# Patient Record
Sex: Male | Born: 1945 | Race: White | Hispanic: No | Marital: Married | Smoking: Former smoker
Health system: Southern US, Community
[De-identification: ages and names within clinical notes are randomized; demographics above are authoritative.]

## PROBLEM LIST (undated history)

## (undated) DIAGNOSIS — I10 Essential (primary) hypertension: Secondary | ICD-10-CM

## (undated) DIAGNOSIS — Z79899 Other long term (current) drug therapy: Secondary | ICD-10-CM

## (undated) DIAGNOSIS — B999 Unspecified infectious disease: Secondary | ICD-10-CM

## (undated) DIAGNOSIS — I6322 Cerebral infarction due to unspecified occlusion or stenosis of basilar arteries: Secondary | ICD-10-CM

## (undated) DIAGNOSIS — L98499 Non-pressure chronic ulcer of skin of other sites with unspecified severity: Secondary | ICD-10-CM

## (undated) DIAGNOSIS — H269 Unspecified cataract: Secondary | ICD-10-CM

## (undated) DIAGNOSIS — E782 Mixed hyperlipidemia: Secondary | ICD-10-CM

## (undated) DIAGNOSIS — K5730 Diverticulosis of large intestine without perforation or abscess without bleeding: Secondary | ICD-10-CM

## (undated) DIAGNOSIS — E7800 Pure hypercholesterolemia, unspecified: Secondary | ICD-10-CM

## (undated) DIAGNOSIS — M1990 Unspecified osteoarthritis, unspecified site: Secondary | ICD-10-CM

## (undated) DIAGNOSIS — I499 Cardiac arrhythmia, unspecified: Secondary | ICD-10-CM

## (undated) DIAGNOSIS — K589 Irritable bowel syndrome without diarrhea: Secondary | ICD-10-CM

## (undated) DIAGNOSIS — I219 Acute myocardial infarction, unspecified: Secondary | ICD-10-CM

## (undated) DIAGNOSIS — IMO0001 Reserved for inherently not codable concepts without codable children: Secondary | ICD-10-CM

## (undated) DIAGNOSIS — K219 Gastro-esophageal reflux disease without esophagitis: Secondary | ICD-10-CM

## (undated) DIAGNOSIS — G629 Polyneuropathy, unspecified: Secondary | ICD-10-CM

## (undated) DIAGNOSIS — J449 Chronic obstructive pulmonary disease, unspecified: Secondary | ICD-10-CM

## (undated) DIAGNOSIS — I6330 Cerebral infarction due to thrombosis of unspecified cerebral artery: Secondary | ICD-10-CM

## (undated) DIAGNOSIS — I209 Angina pectoris, unspecified: Secondary | ICD-10-CM

## (undated) DIAGNOSIS — K929 Disease of digestive system, unspecified: Secondary | ICD-10-CM

## (undated) DIAGNOSIS — C801 Malignant (primary) neoplasm, unspecified: Secondary | ICD-10-CM

## (undated) DIAGNOSIS — J189 Pneumonia, unspecified organism: Secondary | ICD-10-CM

## (undated) DIAGNOSIS — H9193 Unspecified hearing loss, bilateral: Secondary | ICD-10-CM

## (undated) DIAGNOSIS — F32A Depression, unspecified: Secondary | ICD-10-CM

## (undated) DIAGNOSIS — Z01810 Encounter for preprocedural cardiovascular examination: Secondary | ICD-10-CM

## (undated) DIAGNOSIS — K5792 Diverticulitis of intestine, part unspecified, without perforation or abscess without bleeding: Secondary | ICD-10-CM

## (undated) DIAGNOSIS — S7000XA Contusion of unspecified hip, initial encounter: Secondary | ICD-10-CM

## (undated) DIAGNOSIS — M4800 Spinal stenosis, site unspecified: Secondary | ICD-10-CM

## (undated) DIAGNOSIS — R42 Dizziness and giddiness: Secondary | ICD-10-CM

## (undated) DIAGNOSIS — I472 Ventricular tachycardia: Secondary | ICD-10-CM

## (undated) DIAGNOSIS — R51 Headache: Secondary | ICD-10-CM

## (undated) DIAGNOSIS — R519 Headache, unspecified: Secondary | ICD-10-CM

## (undated) DIAGNOSIS — Z96651 Presence of right artificial knee joint: Secondary | ICD-10-CM

## (undated) DIAGNOSIS — M797 Fibromyalgia: Secondary | ICD-10-CM

## (undated) DIAGNOSIS — R011 Cardiac murmur, unspecified: Secondary | ICD-10-CM

## (undated) DIAGNOSIS — E119 Type 2 diabetes mellitus without complications: Secondary | ICD-10-CM

## (undated) DIAGNOSIS — J45909 Unspecified asthma, uncomplicated: Secondary | ICD-10-CM

## (undated) DIAGNOSIS — F329 Major depressive disorder, single episode, unspecified: Secondary | ICD-10-CM

## (undated) DIAGNOSIS — G8190 Hemiplegia, unspecified affecting unspecified side: Secondary | ICD-10-CM

## (undated) DIAGNOSIS — Z8719 Personal history of other diseases of the digestive system: Secondary | ICD-10-CM

## (undated) DIAGNOSIS — Z950 Presence of cardiac pacemaker: Secondary | ICD-10-CM

## (undated) DIAGNOSIS — G4733 Obstructive sleep apnea (adult) (pediatric): Secondary | ICD-10-CM

## (undated) DIAGNOSIS — R55 Syncope and collapse: Secondary | ICD-10-CM

## (undated) DIAGNOSIS — I2510 Atherosclerotic heart disease of native coronary artery without angina pectoris: Secondary | ICD-10-CM

## (undated) DIAGNOSIS — G119 Hereditary ataxia, unspecified: Secondary | ICD-10-CM

## (undated) DIAGNOSIS — T7500XA Unspecified effects of lightning, initial encounter: Secondary | ICD-10-CM

## (undated) DIAGNOSIS — K635 Polyp of colon: Secondary | ICD-10-CM

## (undated) DIAGNOSIS — I639 Cerebral infarction, unspecified: Secondary | ICD-10-CM

## (undated) DIAGNOSIS — R410 Disorientation, unspecified: Secondary | ICD-10-CM

## (undated) DIAGNOSIS — N200 Calculus of kidney: Secondary | ICD-10-CM

## (undated) DIAGNOSIS — M23231 Derangement of other medial meniscus due to old tear or injury, right knee: Secondary | ICD-10-CM

## (undated) DIAGNOSIS — I495 Sick sinus syndrome: Secondary | ICD-10-CM

## (undated) DIAGNOSIS — F419 Anxiety disorder, unspecified: Secondary | ICD-10-CM

## (undated) DIAGNOSIS — I509 Heart failure, unspecified: Secondary | ICD-10-CM

## (undated) HISTORY — DX: Disease of digestive system, unspecified: K92.9

## (undated) HISTORY — DX: Mixed hyperlipidemia: E78.2

## (undated) HISTORY — DX: Essential (primary) hypertension: I10

## (undated) HISTORY — DX: Unspecified hearing loss, bilateral: H91.93

## (undated) HISTORY — PX: COLONOSCOPY: SHX174

## (undated) HISTORY — DX: Ventricular tachycardia: I47.2

## (undated) HISTORY — DX: Syncope and collapse: R55

## (undated) HISTORY — DX: Gastro-esophageal reflux disease without esophagitis: K21.9

## (undated) HISTORY — DX: Spinal stenosis, site unspecified: M48.00

## (undated) HISTORY — DX: Fibromyalgia: M79.7

## (undated) HISTORY — DX: Irritable bowel syndrome without diarrhea: K58.9

## (undated) HISTORY — DX: Obstructive sleep apnea (adult) (pediatric): G47.33

## (undated) HISTORY — DX: Contusion of unspecified hip, initial encounter: S70.00XA

## (undated) HISTORY — DX: Encounter for preprocedural cardiovascular examination: Z01.810

## (undated) HISTORY — PX: SHOULDER SURGERY: SHX246

## (undated) HISTORY — PX: INSERT / REPLACE / REMOVE PACEMAKER: SUR710

## (undated) HISTORY — DX: Other long term (current) drug therapy: Z79.899

## (undated) HISTORY — DX: Hereditary ataxia, unspecified: G11.9

## (undated) HISTORY — DX: Unspecified infectious disease: B99.9

## (undated) HISTORY — DX: Cerebral infarction due to thrombosis of unspecified cerebral artery: I63.30

## (undated) HISTORY — DX: Diverticulosis of large intestine without perforation or abscess without bleeding: K57.30

## (undated) HISTORY — DX: Presence of right artificial knee joint: Z96.651

## (undated) HISTORY — DX: Unspecified osteoarthritis, unspecified site: M19.90

## (undated) HISTORY — PX: COLON RESECTION: SHX5231

## (undated) HISTORY — DX: Cerebral infarction due to unspecified occlusion or stenosis of basilar artery: I63.22

## (undated) HISTORY — PX: CARDIAC CATHETERIZATION: SHX172

## (undated) HISTORY — PX: OTHER SURGICAL HISTORY: SHX169

## (undated) HISTORY — PX: CORONARY ANGIOPLASTY: SHX604

## (undated) HISTORY — DX: Non-pressure chronic ulcer of skin of other sites with unspecified severity: L98.499

## (undated) HISTORY — DX: Derangement of other medial meniscus due to old tear or injury, right knee: M23.231

## (undated) HISTORY — DX: Presence of cardiac pacemaker: Z95.0

## (undated) HISTORY — DX: Atherosclerotic heart disease of native coronary artery without angina pectoris: I25.10

## (undated) HISTORY — PX: APPENDECTOMY: SHX54

## (undated) HISTORY — DX: Sick sinus syndrome: I49.5

## (undated) HISTORY — DX: Type 2 diabetes mellitus without complications: E11.9

## (undated) HISTORY — DX: Hemiplegia, unspecified affecting unspecified side: G81.90

---

## 1998-04-30 ENCOUNTER — Inpatient Hospital Stay (HOSPITAL_COMMUNITY): Admission: EM | Admit: 1998-04-30 | Discharge: 1998-05-01 | Payer: Self-pay | Admitting: Cardiology

## 2002-10-01 ENCOUNTER — Observation Stay (HOSPITAL_COMMUNITY): Admission: AD | Admit: 2002-10-01 | Discharge: 2002-10-02 | Payer: Self-pay | Admitting: Cardiology

## 2003-12-04 ENCOUNTER — Observation Stay (HOSPITAL_COMMUNITY): Admission: AD | Admit: 2003-12-04 | Discharge: 2003-12-05 | Payer: Self-pay | Admitting: Cardiology

## 2005-02-16 ENCOUNTER — Emergency Department (HOSPITAL_COMMUNITY): Admission: EM | Admit: 2005-02-16 | Discharge: 2005-02-16 | Payer: Self-pay | Admitting: Emergency Medicine

## 2005-02-16 ENCOUNTER — Ambulatory Visit: Payer: Self-pay | Admitting: Family Medicine

## 2005-02-27 ENCOUNTER — Ambulatory Visit: Payer: Self-pay | Admitting: Family Medicine

## 2005-04-04 ENCOUNTER — Ambulatory Visit: Payer: Self-pay | Admitting: Family Medicine

## 2005-07-06 ENCOUNTER — Ambulatory Visit: Payer: Self-pay | Admitting: Family Medicine

## 2005-10-03 ENCOUNTER — Ambulatory Visit: Payer: Self-pay | Admitting: Family Medicine

## 2005-10-11 ENCOUNTER — Ambulatory Visit: Payer: Self-pay | Admitting: Family Medicine

## 2013-12-27 DIAGNOSIS — G8190 Hemiplegia, unspecified affecting unspecified side: Secondary | ICD-10-CM

## 2013-12-27 HISTORY — DX: Hemiplegia, unspecified affecting unspecified side: G81.90

## 2013-12-30 DIAGNOSIS — E119 Type 2 diabetes mellitus without complications: Secondary | ICD-10-CM | POA: Insufficient documentation

## 2013-12-30 DIAGNOSIS — I6322 Cerebral infarction due to unspecified occlusion or stenosis of basilar arteries: Secondary | ICD-10-CM | POA: Insufficient documentation

## 2013-12-30 DIAGNOSIS — R55 Syncope and collapse: Secondary | ICD-10-CM

## 2013-12-30 DIAGNOSIS — I10 Essential (primary) hypertension: Secondary | ICD-10-CM

## 2013-12-30 DIAGNOSIS — I6330 Cerebral infarction due to thrombosis of unspecified cerebral artery: Secondary | ICD-10-CM

## 2013-12-30 DIAGNOSIS — M797 Fibromyalgia: Secondary | ICD-10-CM | POA: Insufficient documentation

## 2013-12-30 DIAGNOSIS — I639 Cerebral infarction, unspecified: Secondary | ICD-10-CM | POA: Insufficient documentation

## 2013-12-30 DIAGNOSIS — K929 Disease of digestive system, unspecified: Secondary | ICD-10-CM | POA: Insufficient documentation

## 2013-12-30 DIAGNOSIS — M1990 Unspecified osteoarthritis, unspecified site: Secondary | ICD-10-CM

## 2013-12-30 DIAGNOSIS — L98499 Non-pressure chronic ulcer of skin of other sites with unspecified severity: Secondary | ICD-10-CM

## 2013-12-30 DIAGNOSIS — I119 Hypertensive heart disease without heart failure: Secondary | ICD-10-CM

## 2013-12-30 DIAGNOSIS — B999 Unspecified infectious disease: Secondary | ICD-10-CM

## 2013-12-30 DIAGNOSIS — IMO0001 Reserved for inherently not codable concepts without codable children: Secondary | ICD-10-CM

## 2013-12-30 DIAGNOSIS — G8929 Other chronic pain: Secondary | ICD-10-CM

## 2013-12-30 HISTORY — DX: Fibromyalgia: M79.7

## 2013-12-30 HISTORY — DX: Unspecified osteoarthritis, unspecified site: M19.90

## 2013-12-30 HISTORY — DX: Syncope and collapse: R55

## 2013-12-30 HISTORY — DX: Other chronic pain: G89.29

## 2013-12-30 HISTORY — DX: Essential (primary) hypertension: I10

## 2013-12-30 HISTORY — DX: Unspecified infectious disease: B99.9

## 2013-12-30 HISTORY — DX: Hypertensive heart disease without heart failure: I11.9

## 2013-12-30 HISTORY — DX: Cerebral infarction due to unspecified occlusion or stenosis of basilar artery: I63.22

## 2013-12-30 HISTORY — DX: Type 2 diabetes mellitus without complications: E11.9

## 2013-12-30 HISTORY — DX: Cerebral infarction due to thrombosis of unspecified cerebral artery: I63.30

## 2013-12-30 HISTORY — DX: Cerebral infarction, unspecified: I63.9

## 2013-12-30 HISTORY — DX: Reserved for inherently not codable concepts without codable children: IMO0001

## 2013-12-30 HISTORY — DX: Disease of digestive system, unspecified: K92.9

## 2014-03-11 ENCOUNTER — Observation Stay: Payer: Self-pay | Admitting: Internal Medicine

## 2014-03-11 LAB — CBC WITH DIFFERENTIAL/PLATELET
Basophil #: 0.1 10*3/uL (ref 0.0–0.1)
Basophil %: 1.2 %
Eosinophil #: 0.2 10*3/uL (ref 0.0–0.7)
Eosinophil %: 2.1 %
HCT: 44.6 % (ref 40.0–52.0)
HGB: 15.4 g/dL (ref 13.0–18.0)
Lymphocyte #: 1.5 10*3/uL (ref 1.0–3.6)
Lymphocyte %: 19.1 %
MCH: 31.8 pg (ref 26.0–34.0)
MCHC: 34.5 g/dL (ref 32.0–36.0)
MCV: 92 fL (ref 80–100)
Monocyte #: 0.6 x10 3/mm (ref 0.2–1.0)
Monocyte %: 7.9 %
Neutrophil #: 5.5 10*3/uL (ref 1.4–6.5)
Neutrophil %: 69.7 %
Platelet: 215 10*3/uL (ref 150–440)
RBC: 4.84 10*6/uL (ref 4.40–5.90)
RDW: 14.3 % (ref 11.5–14.5)
WBC: 7.9 10*3/uL (ref 3.8–10.6)

## 2014-03-11 LAB — URINALYSIS, COMPLETE
Bacteria: NONE SEEN
Bilirubin,UR: NEGATIVE
Blood: NEGATIVE
Glucose,UR: >=500 mg/dL (ref 0–75)
Hyaline Cast: 2
Ketone: NEGATIVE
Leukocyte Esterase: NEGATIVE
Nitrite: NEGATIVE
Ph: 5 (ref 4.5–8.0)
Protein: NEGATIVE
RBC,UR: <1 /HPF (ref 0–5)
Specific Gravity: 1.036 (ref 1.003–1.030)
Squamous Epithelial: <1
WBC UR: 3 /HPF (ref 0–5)

## 2014-03-11 LAB — COMPREHENSIVE METABOLIC PANEL
Albumin: 3.7 g/dL (ref 3.4–5.0)
Alkaline Phosphatase: 57 U/L
Anion Gap: 8 (ref 7–16)
BUN: 16 mg/dL (ref 7–18)
Bilirubin,Total: 1.7 mg/dL — ABNORMAL HIGH (ref 0.2–1.0)
Calcium, Total: 9.5 mg/dL (ref 8.5–10.1)
Chloride: 103 mmol/L (ref 98–107)
Co2: 27 mmol/L (ref 21–32)
Creatinine: 0.87 mg/dL (ref 0.60–1.30)
EGFR (African American): >60
EGFR (Non-African Amer.): >60
Glucose: 246 mg/dL — ABNORMAL HIGH (ref 65–99)
Osmolality: 285 (ref 275–301)
Potassium: 3.8 mmol/L (ref 3.5–5.1)
SGOT(AST): 33 U/L (ref 15–37)
SGPT (ALT): 40 U/L (ref 12–78)
Sodium: 138 mmol/L (ref 136–145)
Total Protein: 6.9 g/dL (ref 6.4–8.2)

## 2014-03-11 LAB — TROPONIN I: Troponin-I: <0.02 ng/mL

## 2014-03-12 ENCOUNTER — Ambulatory Visit: Payer: Self-pay | Admitting: Neurology

## 2014-03-12 LAB — BASIC METABOLIC PANEL
Anion Gap: 8 (ref 7–16)
BUN: 10 mg/dL (ref 7–18)
Calcium, Total: 8.7 mg/dL (ref 8.5–10.1)
Chloride: 106 mmol/L (ref 98–107)
Co2: 26 mmol/L (ref 21–32)
Creatinine: 0.77 mg/dL (ref 0.60–1.30)
EGFR (African American): >60
EGFR (Non-African Amer.): >60
Glucose: 137 mg/dL — ABNORMAL HIGH (ref 65–99)
Osmolality: 281 (ref 275–301)
Potassium: 3.9 mmol/L (ref 3.5–5.1)
Sodium: 140 mmol/L (ref 136–145)

## 2014-03-12 LAB — TSH: Thyroid Stimulating Horm: 1.01 u[IU]/mL

## 2014-03-12 LAB — CBC WITH DIFFERENTIAL/PLATELET
Basophil #: 0.1 10*3/uL (ref 0.0–0.1)
Basophil %: 1.3 %
Eosinophil #: 0.2 10*3/uL (ref 0.0–0.7)
Eosinophil %: 2.7 %
HCT: 43.1 % (ref 40.0–52.0)
HGB: 14.3 g/dL (ref 13.0–18.0)
Lymphocyte #: 1.6 10*3/uL (ref 1.0–3.6)
Lymphocyte %: 22.3 %
MCH: 30.5 pg (ref 26.0–34.0)
MCHC: 33.3 g/dL (ref 32.0–36.0)
MCV: 92 fL (ref 80–100)
Monocyte #: 0.6 x10 3/mm (ref 0.2–1.0)
Monocyte %: 9 %
Neutrophil #: 4.6 10*3/uL (ref 1.4–6.5)
Neutrophil %: 64.7 %
Platelet: 187 10*3/uL (ref 150–440)
RBC: 4.71 10*6/uL (ref 4.40–5.90)
RDW: 14.1 % (ref 11.5–14.5)
WBC: 7 10*3/uL (ref 3.8–10.6)

## 2014-03-12 LAB — LIPID PANEL
Cholesterol: 205 mg/dL — ABNORMAL HIGH (ref 0–200)
HDL Cholesterol: 42 mg/dL (ref 40–60)
Ldl Cholesterol, Calc: 115 mg/dL — ABNORMAL HIGH (ref 0–100)
Triglycerides: 242 mg/dL — ABNORMAL HIGH (ref 0–200)
VLDL Cholesterol, Calc: 48 mg/dL — ABNORMAL HIGH (ref 5–40)

## 2014-03-12 LAB — HEMOGLOBIN A1C: Hemoglobin A1C: 6.9 % — ABNORMAL HIGH (ref 4.2–6.3)

## 2014-03-12 LAB — MAGNESIUM: Magnesium: 1.8 mg/dL

## 2015-01-02 NOTE — Consult Note (Signed)
PATIENT NAME:  William Small, William Small MR#:  [id number] DATE OF BIRTH:  1946/07/29  DATE OF CONSULTATION:  03/12/2014  REFERRING PHYSICIAN:   CONSULTING PHYSICIAN:  Leotis Pain, MD  REASON FOR CONSULTATION: TIA, rule out stroke.   HISTORY OF PRESENT ILLNESS: This is a pleasant 69 year old gentleman with past medical history of multiple strokes in the past, status post TPA x2, last one in April of this year at [facility]. Also past medical history of hypertension, diabetes, and COPD. The patient states that yesterday he was in normal health. At around 11:45 he noticed some numbness on the left side associated with heat sensation going from the back and radiating into the back of the head, short-lived and self resolved. The numbness has resolved and the patient is currently back to baseline. No visual deficits. No speech abnormalities. The patient is status post imaging, including CAT scan and MRI, that did not show any acute intracranial abnormality. The patient is on dual antiplatelet, aspirin 325 and Plavix 75 daily, which he takes consistently daily.   PAST MEDICAL HISTORY: COPD, hypertension, diabetes, multiple strokes, status post TPA x2, GERD, diverticulitis.  PAST SURGICAL HISTORY: Include colon resection and appendectomy.   FAMILY HISTORY: History of Alzheimer disease in his mother and his sister has breast cancer.   SOCIAL HISTORY: He quit smoking. No EtOH or drug use.   HOME MEDICATIONS: Include aspirin 325, losartan, Plavix 75, atorvastatin, dicyclomine, Ultram, metformin, Protonix, and Neurontin.   ALLERGIES: Include IODINE, PENICILLIN, SHELLFISH, BEE STINGS.   REVIEW OF SYSTEMS:  CONSTITUTIONAL: Denies any fever or fatigue.  HEENT: No blurred vision, double vision. No tinnitus or hearing loss.  RESPIRATORY: No cough, wheezing or shortness of breath. CARDIOVASCULAR: No chest pain.  ENDOCRINOLOGY: No heat or cold intolerance.  PSYCHOLOGIC: Denies any anxiety or depression.   NEUROLOGIC: History of multiple strokes in the past with slight left upper extremity weakness.  PHYSICAL EXAMINATION: FLOW SHEET AND VITAL SIGNS: Includes a temperature of 97.3, pulse 76, respirations 16, blood pressure 162/72.  NEUROLOGIC: The patient is alert, awake and oriented to time, place and location. Speech appears to be fluent. No signs of dysarthria. No signs of aphasia noted. Facial sensation intact. Facial motor intact. Tongue is midline. Uvula elevates symmetrically. Shoulder shrug is intact. Motor strength 5/5 bilaterally in upper and lower extremities with minimal drift on the left upper extremity, which is 4+/5. Sensation intact to light touch and temperature. Reflexes intact. Coordination: Finger-to-nose intact. Gait not assessed.   IMPRESSION: A 69 year old male with risk factors for stroke that includes hypertension, diabetes and on dual antiplatelets who comes in with left arm numbness as well as heat sensation in his upper back, specifically on his left side, radiating to the back of the head. Symptoms were short-lived and currently have resolved, and the patient is currently at baseline.   PLAN: Continue dual antiplatelet therapy. This could be partial seizure, specifically due to the reason that the patient has multiple strokes and irritation of the neurons. The partial seizures could be resembling Jacksonian March. Will obtain an EEG. Do not need to wait for results for discharge planning. Can have followup EEG as an outpatient. If there are any epileptiform discharges, we will start the patient on antiepileptic medication. If there is epileptiform activity, would also strongly consider discontinuing Ultram to another medication as Ultram lowers the seizure threshold.   This case was discussed with primary team. Thank you. It was a pleasure seeing this patient.   ____________________________ Leotis Pain, MD  yz:sb D: 03/12/2014 11:42:48 ET T: 03/12/2014 12:10:31  ET JOB#: [id number]  cc: Leotis Pain, MD, <Dictator> Leotis Pain MD ELECTRONICALLY SIGNED 03/24/2014 14:55

## 2015-01-02 NOTE — H&P (Signed)
PATIENT NAME:  William Small, William Small MR#:  [id number] DATE OF BIRTH:  04-07-1946  DATE OF ADMISSION:  03/11/2014  REFERRING PHYSICIAN:  Dr. Jacqualine Code.  PRIMARY CARE PHYSICIAN: Dr. Garlon Hatchet at [facility].   PRIMARY NEUROLOGIST: Dr. Metta Clines at [place].   CHIEF COMPLAINT:  Numbness, weakness of left side.   HISTORY OF PRESENT ILLNESS:  The patient is a 69 year old male with a history of multiple strokes in the past. He has had 2 strokes where he had tPA; the last one which was in April of this year where he was hospitalized initially in [place] and then transferred to [place], where he was there for a week. He has had 3 strokes in total and multiple TIAs in the past. 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  He started to have episode of left-sided numbness and some weakness as well on the left side at about 11:45.  It started with the patient feeling dizzy and presyncopal, but he did not faint. It improved, but it came back later as he was driving to a nursing home for a visit, which he usually does. Again, he had a third episode, or again he felt presyncopal and felt hot and this numbness and this heat feeling going around the shoulders. He states that he had started to have numbness on the left side of the face and also increased weakness on the left hand. The numbness is improved, but he has a burning sensation in the face, but he has some pain in the left side of the face now.   He states that he has some chronic left-sided weakness and has very poor mobility due to incoordination, which is a baseline for him. When asked if the weakness is the same as before, he states perhaps it may be a little worse, but this is close to his usual weakness on the left side. He has a CT of the head, which was negative for acute stroke, and hospitalist services were contacted for further evaluation and management.   PAST MEDICAL HISTORY: COPD, hypertension,  diabetes, history of 3 strokes, 2 of which required tPA; last tPA was April of this year with chronic left-sided weakness and incoordination. No speech deficits. Multiple TIAs in the past, GERD, diverticulitis surgeries with 4 inches of colon taken out because of diverticulitis and also he has had appendectomy.   FAMILY HISTORY:  Mom with Alzheimer dementia, sister with breast cancer.   SOCIAL HISTORY:  Quit tobacco 14 years ago. No alcohol or drug use.   OUTPATIENT MEDICATIONS:  Aspirin 325 mg daily, losartan 100 mg once a day, Plavix 75 mg daily, atorvastatin 80 mg daily, dicyclomine 10 mg every 6 hours as needed, Ultram 50 mg every 4 hours as needed for pain, metformin 1000 mg 2 times a day, Protonix 40 mg daily, Neurontin 150 mg once a day and albuterol p.r.n.   ALLERGIES:  IODINE, PENICILLIN, SHELLFISH AND BEE STINGS.   REVIEW OF SYSTEMS:  CONSTITUTIONAL: Denies fever or fatigue. Has chronic left-sided weakness. No weight changes.  EYES:  No blurry vision or double vision.  ENT:  No tinnitus or hearing loss.  RESPIRATORY:  Denies cough, wheezing or shortness of breath. Has COPD.  CARDIOVASCULAR:  Denies chest pain. He does have bouts of palpitations, perhaps lasting 5 minutes of about 3 or 4 times a week. He was never told he has had Afib or any other arrhythmias, but he had to think hard  about that, but then he stated, "I don't know."  GASTROINTESTINAL:  No nausea, vomiting, abdominal pain, bloody stools or tarry stools.  GENITOURINARY:  Denies dysuria or hematuria.  HEMATOLOGIC AND LYMPHATICS:   Denies anemia or easy bruising.  SKIN:  No rashes.  MUSCULOSKELETAL:  Denies arthritis or gout.  NEUROLOGIC:  Has had strokes or mini strokes in the past. Has left-sided weakness and coordination issues.  PSYCHIATRIC:  Denies anxiety or insomnia.   PHYSICAL EXAMINATION: VITAL SIGNS: Temperature on arrival 98.2, pulse rate 97, respiratory rate 18, blood pressure 146/97, O2 sat 94% on room air.   GENERAL: The patient is a well-developed, pleasant and cooperative male lying in bed, no obvious distress.  HEENT: Normocephalic, atraumatic. Pupils are equal and reactive. Anicteric sclerae. Moist mucous membranes.  NECK: Supple. No thyroid tenderness. No cervical lymphadenopathy.  CARDIOVASCULAR: S1, S2. No significant murmurs, rubs or gallops.  LUNGS: Clear to auscultation with good air entry.  ABDOMEN: Soft, nontender, nondistended. Positive bowel sounds.  EXTREMITIES: No pitting edema.  NEUROLOGIC: The patient does have left-sided facial droop. Pupils are equal and reactive. Tongue protrudes to the right. Speech is fluent.  On the right side strength upper and lower extremity is 5/5. On the left side, it is 4+/5 with some weakness in the upper and lower extremity. The patient does have a positive Romberg on the left, as well as poor alternating movement and heel-to-shin; again, on the left.  PSYCHIATRIC: Awake, alert, oriented x 3. Cooperative.   LABORATORIES AND IMAGING:  UA does not suggest infection. CBC within normal limits. Troponin negative. LFTs show bilirubin of 1.7, otherwise within normal limits. Glucose 246, BUN 16, creatinine 0.87, sodium 138, potassium 3.8.   EKG: Normal sinus rhythm. No acute ST elevations or depressions. CAT scan as above.   ASSESSMENT AND PLAN:  We have a 69 year old with history of strokes in the past with residual left-sided weakness and likely cerebellar signs due to his poor coordination issues, which he states he has had "vestibular basilar  blockage" in the past per his neurologist.  Has had tPA twice, chronic obstructive pulmonary disease, hypertension, comes in with neurological findings consistent with either a transient ischemic attack or mild stroke, acute on chronic. However, he has had multiple strokes in the past with residual neurological findings per him, and at this point, it is not very clear what is new and what is old, but in terms of the  positive cerebellar signs, as well as the left-sided weakness, these appear to be old per patient. At this point, would admit the patient for observation and rule out acute stroke with an MRI. He has had extensive workup previously; the last one about 2-1/2 months ago or so at [facility]. We will see if we can get the records. I would admit him with CVA pathway, continue aspirin and Plavix, statin, and hold his blood pressure medications, start him on some fluids, get frequent neuro checks and see what the MRI shows. I would also obtain a neurology consult. He does describe episodes of palpitations. I do not know if these are Afib or he has another form of arrhythmia, but he will be placed on tele to see if he has any such rhythms. Given the multiple strokes, perhaps he might be a candidate for an anticoagulant versus antiplatelet, but would defer to neurology at this point. He will obtain PT consult, check a lipid profile, hemoglobin A1c, continue metformin, add sliding scale insulin, start him on heparin for DVT  prophylaxis and continue his Ultram.   TOTAL TIME SPENT: About 50 minutes.     ____________________________ Vivien Presto, MD sa:dmm D: 03/11/2014 16:10:57 ET T: 03/11/2014 16:31:59 ET JOB#: [id number]  cc: Vivien Presto, MD, <Dictator> Teressa Lower, MD at [facility], MD  [employer] MD ELECTRONICALLY SIGNED 03/20/2014 17:13

## 2015-01-02 NOTE — Discharge Summary (Signed)
PATIENT NAME:  William Small, William Small MR#:  [id number] DATE OF BIRTH:  [birth year]  DATE OF ADMISSION:  03/11/2014 DATE OF DISCHARGE:  03/12/2014  DISCHARGE DIAGNOSIS: Left arm numbness, likely transient ischemic attack or cerebrovascular accident.   SECONDARY DIAGNOSES: 1.  Chronic obstructive pulmonary disease.  2.  Hypertension.  3.  Diabetes.  4.  History of stroke.  5.  History of multiple transient ischemic attacks.  6.  Gastroesophageal reflux disease. 7.  Diverticulitis.    CONSULTATIONS: Neurology, Dr. Irish Elders.   PROCEDURES AND RADIOLOGY: MRI of the brain without contrast on the 1st of July showed no acute intracranial pathology.   CT scan of the head without contrast on the 1st of July showed no acute pathology.   MAJOR LABORATORY PANEL: Urinalysis on admission was negative.   HISTORY AND SHORT HOSPITAL COURSE: The patient is a 69 year old male with the above-mentioned medical problems who was admitted for numbness and weakness on the left side, although it was difficult to determine how much of it was new, and the patient was not sure, either. He underwent MRI of the brain, which was negative including CT scan of the head. Neurology consultation was obtained with Dr. Irish Elders, who felt this unlikely to be transient ischemic attack or cerebrovascular accident. The patient was already on dual antiplatelet therapy, which was recommended to be continued. He did recommend EEG, which was performed and was negative for any seizure or epilepsy.   The patient was close to his baseline and was discharged home on the 2nd July in stable condition. On the date of discharge, his vital signs are as follows: Temperature is 97.5, heart rate 82 per minute, respirations 20 per minute, blood pressure 147/100 mmHg. He was saturating 95% on room air.   PERTINENT PHYSICAL EXAMINATION ON THE DATE OF DISCHARGE: CARDIOVASCULAR: S1, S2 normal. No murmurs, rubs, or gallop.  LUNGS: Clear to auscultation  bilaterally. No wheezing, rales, rhonchi, or crepitation.  ABDOMEN: Soft, benign.  NEUROLOGIC: Nonfocal examination.  All other physical examination remained at baseline.   DISCHARGE MEDICATIONS: 1.  Aspirin 325 mg p.o. daily.  2.  Losartan 100 mg p.o. daily.  3.  Plavix 75 mg p.o. daily.  4.  Atorvastatin 80 mg p.o. at bedtime.  5.  Dicyclomine 10 mg p.o. every six hours as needed.  6.  Ultram 50 mg p.o. every four hours as needed.  7.  Metformin 1000 mg p.o. b.i.d.  8.  Protonix 40 mg p.o. daily.  9.  Neurontin 150 mg p.o. daily.      DISCHARGE DIET: Low sodium.   DISCHARGE ACTIVITY: As tolerated.   DISCHARGE INSTRUCTIONS AND FOLLOW-UP: The patient was instructed to follow up with his primary care physician, Dr. Garlon Hatchet at [facility] in 1 to 2 weeks, as well as recommended to follow up with his neurologist, Dr. Metta Clines, at [place] in 2 to 3 weeks.   TOTAL TIME DISCHARGING THIS PATIENT: 55 minutes.   ____________________________ Lucina Mellow. Manuella Ghazi, MD vss:cg D: 03/13/2014 22:12:54 ET T: 03/14/2014 05:35:30 ET JOB#: [id number]  cc: Rochell Puett S. Manuella Ghazi, MD, <Dictator> Leotis Pain, MD Dr. Garlon Hatchet at [facility] Dr. Metta Clines, at [place] SIGNED 03/15/2014 18:41

## 2015-06-18 DIAGNOSIS — I2510 Atherosclerotic heart disease of native coronary artery without angina pectoris: Secondary | ICD-10-CM | POA: Insufficient documentation

## 2015-06-18 DIAGNOSIS — Z01810 Encounter for preprocedural cardiovascular examination: Secondary | ICD-10-CM

## 2015-06-18 DIAGNOSIS — Z950 Presence of cardiac pacemaker: Secondary | ICD-10-CM | POA: Insufficient documentation

## 2015-06-18 DIAGNOSIS — Z9689 Presence of other specified functional implants: Secondary | ICD-10-CM | POA: Insufficient documentation

## 2015-06-18 DIAGNOSIS — Z79899 Other long term (current) drug therapy: Secondary | ICD-10-CM

## 2015-06-18 HISTORY — DX: Encounter for preprocedural cardiovascular examination: Z01.810

## 2015-06-18 HISTORY — DX: Presence of cardiac pacemaker: Z95.0

## 2015-06-18 HISTORY — DX: Atherosclerotic heart disease of native coronary artery without angina pectoris: I25.10

## 2015-06-18 HISTORY — DX: Presence of other specified functional implants: Z96.89

## 2015-06-18 HISTORY — DX: Other long term (current) drug therapy: Z79.899

## 2015-10-19 DIAGNOSIS — K589 Irritable bowel syndrome without diarrhea: Secondary | ICD-10-CM

## 2015-10-19 DIAGNOSIS — K219 Gastro-esophageal reflux disease without esophagitis: Secondary | ICD-10-CM

## 2015-10-19 DIAGNOSIS — H9193 Unspecified hearing loss, bilateral: Secondary | ICD-10-CM

## 2015-10-19 DIAGNOSIS — E782 Mixed hyperlipidemia: Secondary | ICD-10-CM

## 2015-10-19 DIAGNOSIS — G119 Hereditary ataxia, unspecified: Secondary | ICD-10-CM

## 2015-10-19 DIAGNOSIS — G8190 Hemiplegia, unspecified affecting unspecified side: Secondary | ICD-10-CM

## 2015-10-19 DIAGNOSIS — I4729 Other ventricular tachycardia: Secondary | ICD-10-CM

## 2015-10-19 DIAGNOSIS — J45909 Unspecified asthma, uncomplicated: Secondary | ICD-10-CM | POA: Insufficient documentation

## 2015-10-19 DIAGNOSIS — K5730 Diverticulosis of large intestine without perforation or abscess without bleeding: Secondary | ICD-10-CM

## 2015-10-19 DIAGNOSIS — I472 Ventricular tachycardia, unspecified: Secondary | ICD-10-CM

## 2015-10-19 DIAGNOSIS — Z8673 Personal history of transient ischemic attack (TIA), and cerebral infarction without residual deficits: Secondary | ICD-10-CM

## 2015-10-19 HISTORY — DX: Personal history of transient ischemic attack (TIA), and cerebral infarction without residual deficits: Z86.73

## 2015-10-19 HISTORY — DX: Gastro-esophageal reflux disease without esophagitis: K21.9

## 2015-10-19 HISTORY — DX: Mixed hyperlipidemia: E78.2

## 2015-10-19 HISTORY — DX: Hemiplegia, unspecified affecting unspecified side: G81.90

## 2015-10-19 HISTORY — DX: Other ventricular tachycardia: I47.29

## 2015-10-19 HISTORY — DX: Diverticulosis of large intestine without perforation or abscess without bleeding: K57.30

## 2015-10-19 HISTORY — DX: Hereditary ataxia, unspecified: G11.9

## 2015-10-19 HISTORY — DX: Ventricular tachycardia, unspecified: I47.20

## 2015-10-19 HISTORY — DX: Irritable bowel syndrome, unspecified: K58.9

## 2015-10-19 HISTORY — DX: Unspecified hearing loss, bilateral: H91.93

## 2015-10-19 HISTORY — DX: Ventricular tachycardia: I47.2

## 2015-12-03 ENCOUNTER — Other Ambulatory Visit: Payer: Self-pay | Admitting: Anesthesiology

## 2015-12-03 DIAGNOSIS — M5416 Radiculopathy, lumbar region: Secondary | ICD-10-CM

## 2015-12-13 NOTE — Progress Notes (Signed)
13-hour prep called in to [facility] on [address] in [place] ([id number]).  Doses are all on Monday, 12/20/15 and are at 0001, 0600 and 1200.  Patient knows to take Benadryl 50mg  PO at 1200 that day also.  Brita Romp, RN

## 2015-12-20 ENCOUNTER — Ambulatory Visit
Admission: RE | Admit: 2015-12-20 | Discharge: 2015-12-20 | Disposition: A | Discharge status: Home / Self Care | Payer: Medicare Other | Source: Ambulatory Visit | Attending: Anesthesiology | Admitting: Anesthesiology

## 2015-12-20 DIAGNOSIS — M5416 Radiculopathy, lumbar region: Secondary | ICD-10-CM

## 2015-12-20 MED ORDER — IOPAMIDOL (ISOVUE-M 200) INJECTION 41%
18.0000 mL | Freq: Once | INTRAMUSCULAR | Status: AC
  Administered 2015-12-20: 18 mL via INTRATHECAL

## 2015-12-20 MED ORDER — DIAZEPAM 5 MG PO TABS
5.0000 mg | ORAL_TABLET | Freq: Once | ORAL | Status: AC
  Administered 2015-12-20: 5 mg via ORAL

## 2015-12-20 MED ORDER — ONDANSETRON HCL 4 MG/2ML IJ SOLN
4.0000 mg | Freq: Four times a day (QID) | INTRAMUSCULAR | Status: DC | PRN
Start: 2015-12-20 — End: 2015-12-24

## 2015-12-20 NOTE — Discharge Instructions (Signed)

## 2015-12-20 NOTE — Progress Notes (Signed)
13 hour prep taken.

## 2016-04-04 ENCOUNTER — Other Ambulatory Visit: Payer: Self-pay | Admitting: Anesthesiology

## 2016-04-18 ENCOUNTER — Inpatient Hospital Stay (HOSPITAL_COMMUNITY)
Admission: RE | Admit: 2016-04-18 | Discharge: 2016-04-18 | Disposition: A | Discharge status: Home / Self Care | Payer: Medicare Other | Source: Ambulatory Visit

## 2016-04-18 NOTE — Progress Notes (Signed)
I called patient to see if he was coming to his pre admission appointment and that patient stated that he was not coming.  He stated that "someone called me from pre-services requesting me to pay 1,000$ up front for my surgery"  "i can not pay that at this time so I am not having the surgery".   I instructed patient to contact the surgeons office to discuss this with them.  I called surgeons office to notify them of patient canceling appointemnts

## 2016-04-21 ENCOUNTER — Ambulatory Visit (HOSPITAL_COMMUNITY): Admission: RE | Admit: 2016-04-21 | Payer: Medicare Other | Source: Ambulatory Visit | Admitting: Anesthesiology

## 2016-04-21 ENCOUNTER — Encounter (HOSPITAL_COMMUNITY): Admission: RE | Payer: Self-pay | Source: Ambulatory Visit

## 2016-04-21 SURGERY — INSERTION, SPINAL CORD STIMULATOR, LUMBAR
Anesthesia: Monitor Anesthesia Care

## 2016-05-03 ENCOUNTER — Other Ambulatory Visit: Payer: Self-pay | Admitting: Anesthesiology

## 2016-05-31 ENCOUNTER — Other Ambulatory Visit: Payer: Self-pay | Admitting: Neurosurgery

## 2016-06-02 NOTE — Pre-Procedure Instructions (Signed)
William Small  06/02/2016      [id number], [place] [id number] [place] [id number] Phone: [phone] Fax: [phone]    Your procedure is scheduled on October 6  Report to [place] at [place].M.  Call this number if you have problems the morning of surgery:  [phone]   Remember:  Do not eat food or drink liquids after midnight.   Take these medicines the morning of surgery with A SIP OF WATER acebutolol, amiodarone (pacerone), cyclobenzaprine (flexeril), gabapentin (neurontin), hydromorphone (dilaudid), levetiracetam (keppra), pantoprazole (protonix), ranitidine (zantac), tamsulosin (flomax), eye drops, tramadol If needed  7 days prior to surgery STOP taking any Aspirin, Aleve, Naproxen, Ibuprofen, Motrin, Advil, Goody's, BC's, all herbal medications, fish oil, and all vitamins   WHAT DO I DO ABOUT MY DIABETES MEDICATION?   Marland Kitchen Do not take oral diabetes medicines (pills) the morning of surgery. METFORMIN .   How to Manage Your Diabetes Before and After Surgery  Why is it important to control my blood sugar before and after surgery? . Improving blood sugar levels before and after surgery helps healing and can limit problems. . A way of improving blood sugar control is eating a healthy diet by: o  Eating less sugar and carbohydrates o  Increasing activity/exercise o  Talking with your doctor about reaching your blood sugar goals . High blood sugars (greater than 180 mg/dL) can raise your risk of infections and slow your recovery, so you will need to focus on controlling your diabetes during the weeks before surgery. . Make sure that the doctor who takes care of your diabetes knows about your planned surgery including the date and location.  How do I manage my blood sugar before surgery? . Check your blood sugar at least 4 times a day, starting 2 days before surgery, to make sure  that the level is not too high or low. o Check your blood sugar the morning of your surgery when you wake up and every 2 hours until you get to the Short Stay unit. . If your blood sugar is less than 70 mg/dL, you will need to treat for low blood sugar: o Do not take insulin. o Treat a low blood sugar (less than 70 mg/dL) with  cup of clear juice (cranberry or apple), 4 glucose tablets, OR glucose gel. o Recheck blood sugar in 15 minutes after treatment (to make sure it is greater than 70 mg/dL). If your blood sugar is not greater than 70 mg/dL on recheck, call [phone] for further instructions. . Report your blood sugar to the short stay nurse when you get to Short Stay.  . If you are admitted to the hospital after surgery: o Your blood sugar will be checked by the staff and you will probably be given insulin after surgery (instead of oral diabetes medicines) to make sure you have good blood sugar levels. o The goal for blood sugar control after surgery is 80-180 mg/dL.    Do not wear jewelry, make-up or nail polish.  Do not wear lotions, powders, or perfumes, or deoderant.  Do not shave 48 hours prior to surgery.  Men may shave face and neck.  Do not bring valuables to the hospital.  [facility] is not responsible for any belongings or valuables.  Contacts, dentures or bridgework may not be worn into surgery.  Leave your suitcase in the car.  After  surgery it may be brought to your room.  For patients admitted to the hospital, discharge time will be determined by your treatment team.  Patients discharged the day of surgery will not be allowed to drive home.    Special instructions:   [place]- Preparing For Surgery  Before surgery, you can play an important role. Because skin is not sterile, your skin needs to be as free of germs as possible. You can reduce the number of germs on your skin by washing with CHG (chlorahexidine gluconate) Soap before surgery.  CHG is an antiseptic  cleaner which kills germs and bonds with the skin to continue killing germs even after washing.  Please do not use if you have an allergy to CHG or antibacterial soaps. If your skin becomes reddened/irritated stop using the CHG.  Do not shave (including legs and underarms) for at least 48 hours prior to first CHG shower. It is OK to shave your face.  Please follow these instructions carefully.   1. Shower the NIGHT BEFORE SURGERY and the MORNING OF SURGERY with CHG.   2. If you chose to wash your hair, wash your hair first as usual with your normal shampoo.  3. After you shampoo, rinse your hair and body thoroughly to remove the shampoo.  4. Use CHG as you would any other liquid soap. You can apply CHG directly to the skin and wash gently with a scrungie or a clean washcloth.   5. Apply the CHG Soap to your body ONLY FROM THE NECK DOWN.  Do not use on open wounds or open sores. Avoid contact with your eyes, ears, mouth and genitals (private parts). Wash genitals (private parts) with your normal soap.  6. Wash thoroughly, paying special attention to the area where your surgery will be performed.  7. Thoroughly rinse your body with warm water from the neck down.  8. DO NOT shower/wash with your normal soap after using and rinsing off the CHG Soap.  9. Pat yourself dry with a CLEAN TOWEL.   10. Wear CLEAN PAJAMAS   11. Place CLEAN SHEETS on your bed the night of your first shower and DO NOT SLEEP WITH PETS.    Day of Surgery: Do not apply any deodorants/lotions. Please wear clean clothes to the hospital/surgery center.      Please read over the following fact sheets that you were given. Coughing and Deep Breathing, MRSA Information and Surgical Site Infection Prevention

## 2016-06-05 ENCOUNTER — Encounter (HOSPITAL_COMMUNITY)
Admission: RE | Admit: 2016-06-05 | Discharge: 2016-06-05 | Disposition: A | Discharge status: Home / Self Care | Payer: Medicare Other | Source: Ambulatory Visit | Attending: Anesthesiology | Admitting: Anesthesiology

## 2016-06-05 ENCOUNTER — Encounter (HOSPITAL_COMMUNITY): Payer: Self-pay

## 2016-06-05 DIAGNOSIS — K635 Polyp of colon: Secondary | ICD-10-CM | POA: Diagnosis not present

## 2016-06-05 DIAGNOSIS — Z01812 Encounter for preprocedural laboratory examination: Secondary | ICD-10-CM | POA: Insufficient documentation

## 2016-06-05 DIAGNOSIS — I2510 Atherosclerotic heart disease of native coronary artery without angina pectoris: Secondary | ICD-10-CM | POA: Insufficient documentation

## 2016-06-05 DIAGNOSIS — M5416 Radiculopathy, lumbar region: Secondary | ICD-10-CM | POA: Diagnosis present

## 2016-06-05 DIAGNOSIS — G8929 Other chronic pain: Secondary | ICD-10-CM | POA: Diagnosis not present

## 2016-06-05 DIAGNOSIS — I110 Hypertensive heart disease with heart failure: Secondary | ICD-10-CM | POA: Insufficient documentation

## 2016-06-05 DIAGNOSIS — I509 Heart failure, unspecified: Secondary | ICD-10-CM | POA: Insufficient documentation

## 2016-06-05 DIAGNOSIS — I252 Old myocardial infarction: Secondary | ICD-10-CM | POA: Insufficient documentation

## 2016-06-05 DIAGNOSIS — Z01810 Encounter for preprocedural cardiovascular examination: Secondary | ICD-10-CM | POA: Insufficient documentation

## 2016-06-05 DIAGNOSIS — E119 Type 2 diabetes mellitus without complications: Secondary | ICD-10-CM | POA: Insufficient documentation

## 2016-06-05 DIAGNOSIS — Z87442 Personal history of urinary calculi: Secondary | ICD-10-CM | POA: Diagnosis not present

## 2016-06-05 DIAGNOSIS — J449 Chronic obstructive pulmonary disease, unspecified: Secondary | ICD-10-CM | POA: Insufficient documentation

## 2016-06-05 DIAGNOSIS — E7800 Pure hypercholesterolemia, unspecified: Secondary | ICD-10-CM | POA: Insufficient documentation

## 2016-06-05 HISTORY — DX: Cerebral infarction, unspecified: I63.9

## 2016-06-05 HISTORY — DX: Polyp of colon: K63.5

## 2016-06-05 HISTORY — DX: Depression, unspecified: F32.A

## 2016-06-05 HISTORY — DX: Headache: R51

## 2016-06-05 HISTORY — DX: Personal history of other diseases of the digestive system: Z87.19

## 2016-06-05 HISTORY — DX: Chronic obstructive pulmonary disease, unspecified: J44.9

## 2016-06-05 HISTORY — DX: Angina pectoris, unspecified: I20.9

## 2016-06-05 HISTORY — DX: Atherosclerotic heart disease of native coronary artery without angina pectoris: I25.10

## 2016-06-05 HISTORY — DX: Unspecified asthma, uncomplicated: J45.909

## 2016-06-05 HISTORY — DX: Acute myocardial infarction, unspecified: I21.9

## 2016-06-05 HISTORY — DX: Unspecified effects of lightning, initial encounter: T75.00XA

## 2016-06-05 HISTORY — DX: Disorientation, unspecified: R41.0

## 2016-06-05 HISTORY — DX: Diverticulitis of intestine, part unspecified, without perforation or abscess without bleeding: K57.92

## 2016-06-05 HISTORY — DX: Essential (primary) hypertension: I10

## 2016-06-05 HISTORY — DX: Cardiac murmur, unspecified: R01.1

## 2016-06-05 HISTORY — DX: Malignant (primary) neoplasm, unspecified: C80.1

## 2016-06-05 HISTORY — DX: Polyneuropathy, unspecified: G62.9

## 2016-06-05 HISTORY — DX: Reserved for inherently not codable concepts without codable children: IMO0001

## 2016-06-05 HISTORY — DX: Calculus of kidney: N20.0

## 2016-06-05 HISTORY — DX: Major depressive disorder, single episode, unspecified: F32.9

## 2016-06-05 HISTORY — DX: Dizziness and giddiness: R42

## 2016-06-05 HISTORY — DX: Heart failure, unspecified: I50.9

## 2016-06-05 HISTORY — DX: Gastro-esophageal reflux disease without esophagitis: K21.9

## 2016-06-05 HISTORY — DX: Pure hypercholesterolemia, unspecified: E78.00

## 2016-06-05 HISTORY — DX: Unspecified cataract: H26.9

## 2016-06-05 HISTORY — DX: Headache, unspecified: R51.9

## 2016-06-05 HISTORY — DX: Type 2 diabetes mellitus without complications: E11.9

## 2016-06-05 HISTORY — DX: Cardiac arrhythmia, unspecified: I49.9

## 2016-06-05 HISTORY — DX: Presence of cardiac pacemaker: Z95.0

## 2016-06-05 HISTORY — DX: Pneumonia, unspecified organism: J18.9

## 2016-06-05 HISTORY — DX: Unspecified osteoarthritis, unspecified site: M19.90

## 2016-06-05 HISTORY — DX: Anxiety disorder, unspecified: F41.9

## 2016-06-05 LAB — CBC
HCT: 41.7 % (ref 39.0–52.0)
HEMOGLOBIN: 13.8 g/dL (ref 13.0–17.0)
MCH: 29.9 pg (ref 26.0–34.0)
MCHC: 33.1 g/dL (ref 30.0–36.0)
MCV: 90.3 fL (ref 78.0–100.0)
PLATELETS: 192 10*3/uL (ref 150–400)
RBC: 4.62 MIL/uL (ref 4.22–5.81)
RDW: 13.5 % (ref 11.5–15.5)
WBC: 7.4 10*3/uL (ref 4.0–10.5)

## 2016-06-05 LAB — BASIC METABOLIC PANEL
Anion gap: 7 (ref 5–15)
BUN: 11 mg/dL (ref 6–20)
CHLORIDE: 106 mmol/L (ref 101–111)
CO2: 25 mmol/L (ref 22–32)
CREATININE: 0.97 mg/dL (ref 0.61–1.24)
Calcium: 9.3 mg/dL (ref 8.9–10.3)
GFR calc non Af Amer: >60 mL/min (ref >60)
GLUCOSE: 119 mg/dL — AB (ref 65–99)
Potassium: 4.1 mmol/L (ref 3.5–5.1)
Sodium: 138 mmol/L (ref 135–145)

## 2016-06-05 LAB — SURGICAL PCR SCREEN
MRSA, PCR: NEGATIVE
Staphylococcus aureus: NEGATIVE

## 2016-06-05 LAB — GLUCOSE, CAPILLARY: Glucose-Capillary: 122 mg/dL — ABNORMAL HIGH (ref 65–99)

## 2016-06-05 LAB — HEMOGLOBIN A1C
Hgb A1c MFr Bld: 6.4 % — ABNORMAL HIGH (ref 4.8–5.6)
Mean Plasma Glucose: 137 mg/dL

## 2016-06-05 NOTE — Progress Notes (Signed)
PCP - Teressa Lower - [facility] [place] Cardiologist - Shirlee More  Chest x-ray -  EKG - 06/05/16 - abnormal Stress Test - 12/05/15 ECHO - 12/26/13 Cardiac Cath - 2015  Patient is diabetic and monitors his blood sugars at home.  Patient stated that he has sharp pain in his chest last night and early this morning that felt like a squeezing pain with eventual release.  Patient stated that the pain took his breath.  He described his pain as a mild heart attack pain more than just indigestion.    Spoke with Ebony Hail who will speak with patient regarding chest pain  [facility] is [facility]  Patient stopped Plavix on 06/02/16
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
## 2016-06-06 IMAGING — CT CT L SPINE W/ CM
3 of 8 series · 11 of 33 positions shown, 13 images · non-contrast
Comparison: MRI 09/22/2013

CLINICAL DATA: Low back and bilateral leg pain. Spondylosis without
myelopathy. History of anaphylactic reaction to iodinated contrast.
13 hour prep performed Previous with success.
TECHNIQUE: Contiguous axial images were obtained through the Lumbar spine after
the intrathecal infusion of infusion. Coronal and sagittal
reconstructions were obtained of the axial image sets.

[Series 2: l spine soft · axial · 0.31mm/px · z∈[-110,+34]mm · 3 of 85 slices shown, 4 images]
[im 25/85  soft-tissue]
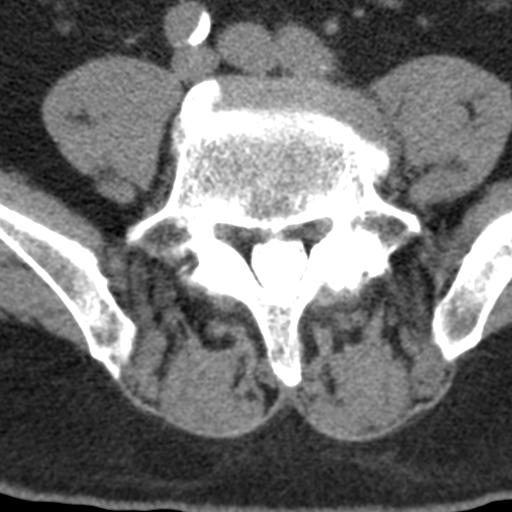
[im 25/85  bone]
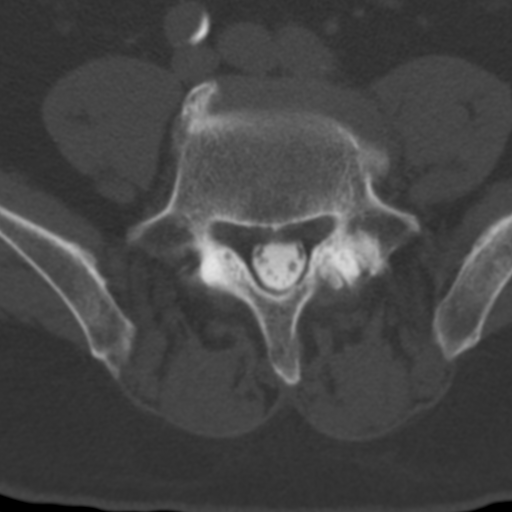
[im 49/85  bone]
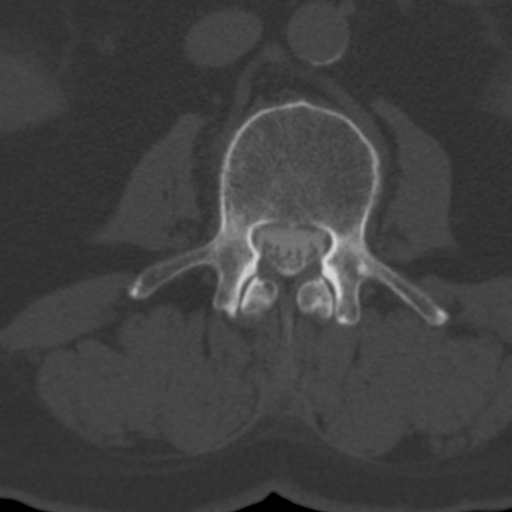
[im 73/85  bone]
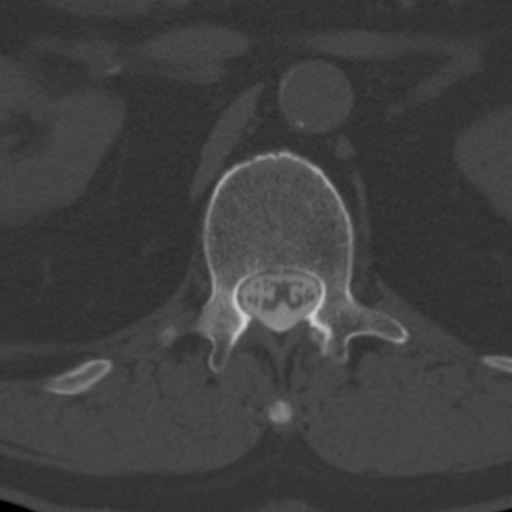

[Series 6: bone cor · coronal · 0.30mm/px · 3 of 51 slices shown]
[im 11/51  bone]
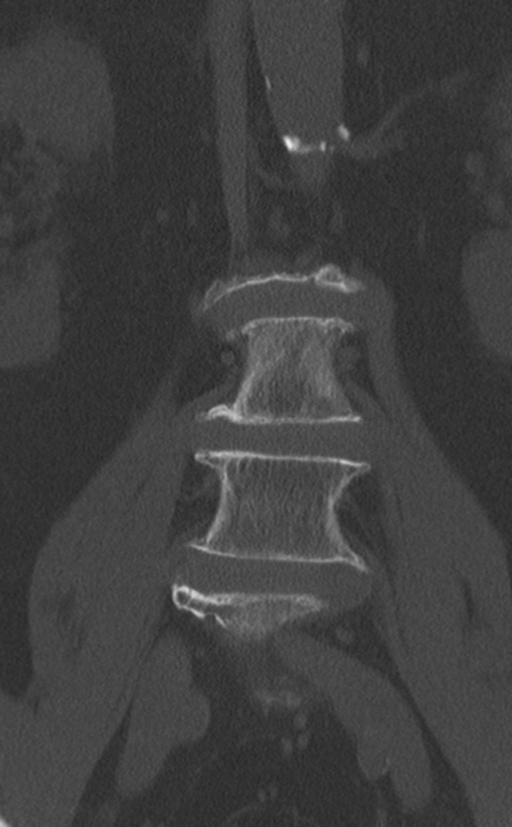
[im 21/51  bone]
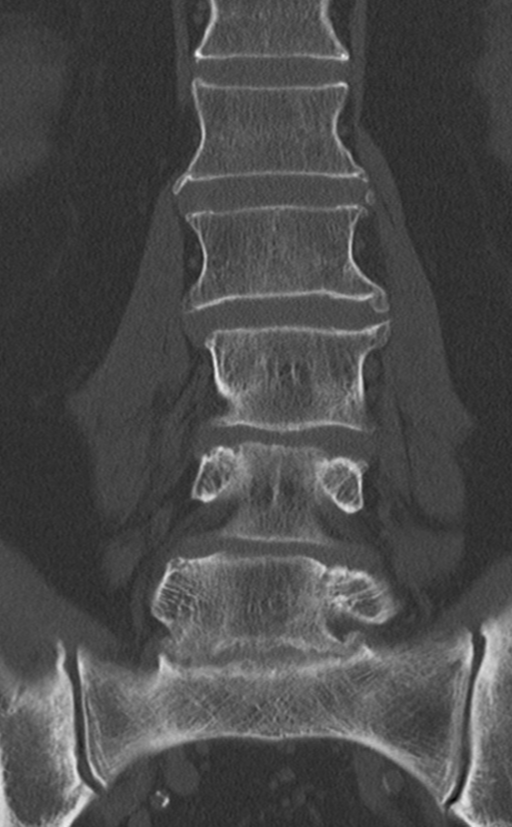
[im 31/51  bone]
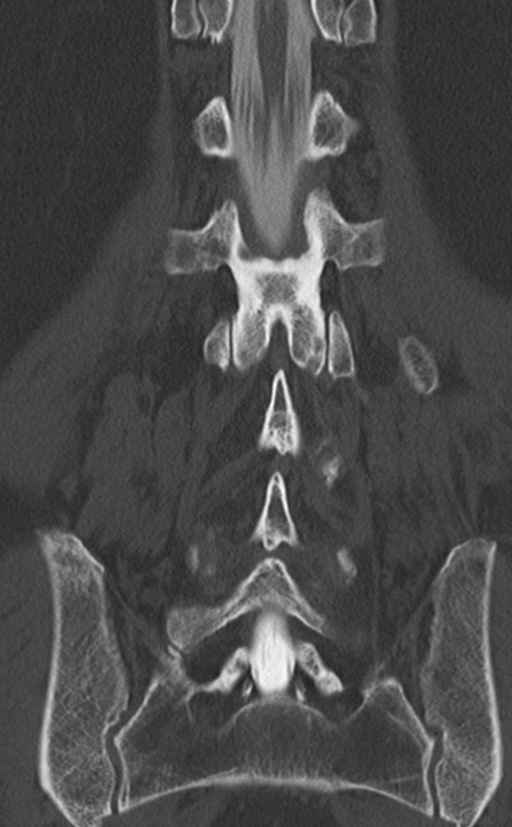

[Series 7: sag bone · sagittal · 0.32mm/px · 5 of 52 slices shown, 6 images]
[im 18/52  bone]
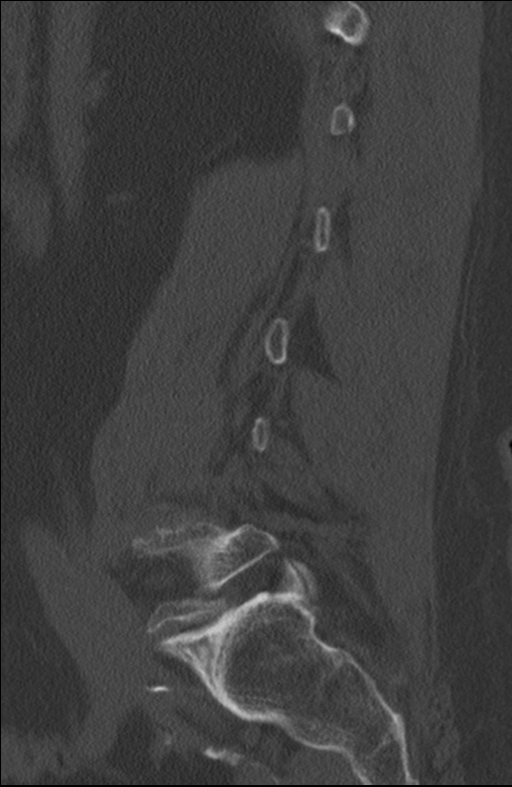
[im 22/52  bone]
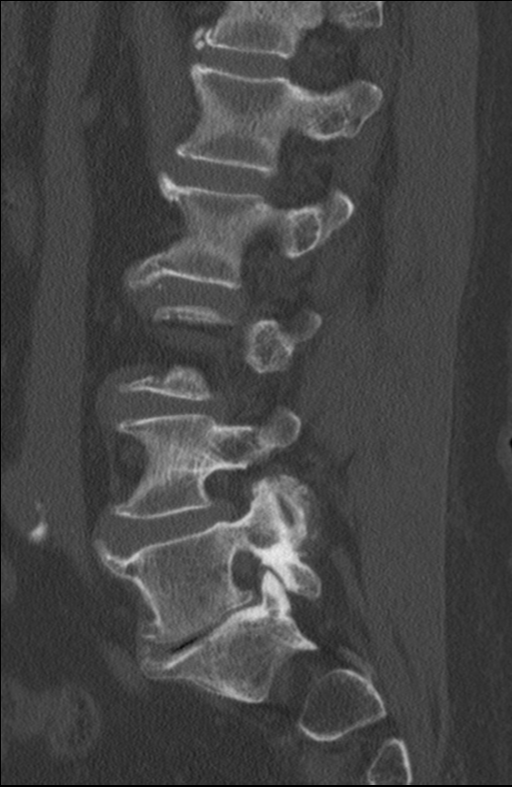
[im 26/52  soft-tissue]
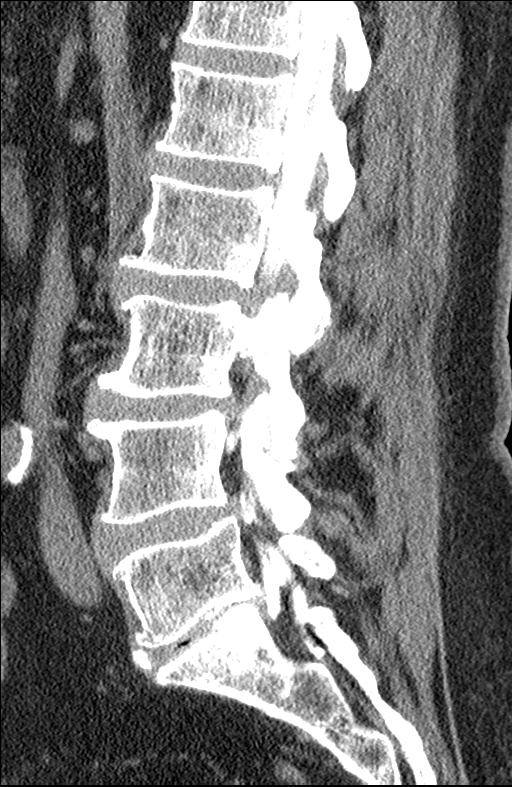
[im 26/52  bone]
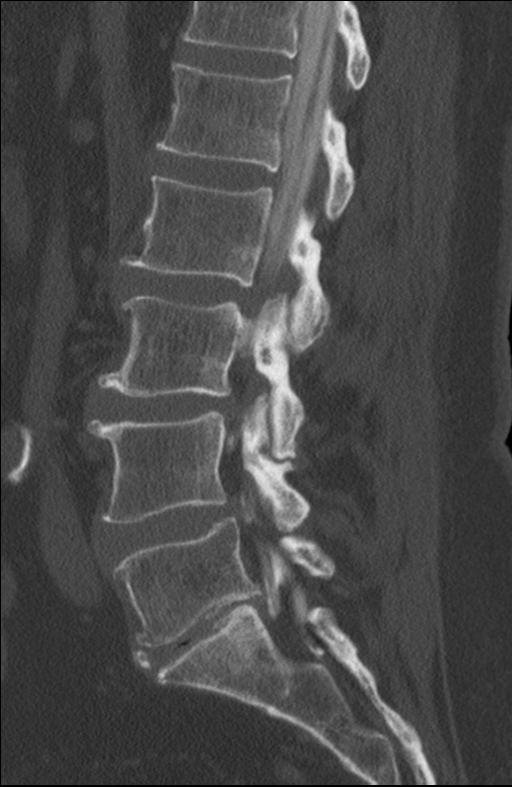
[im 30/52  bone]
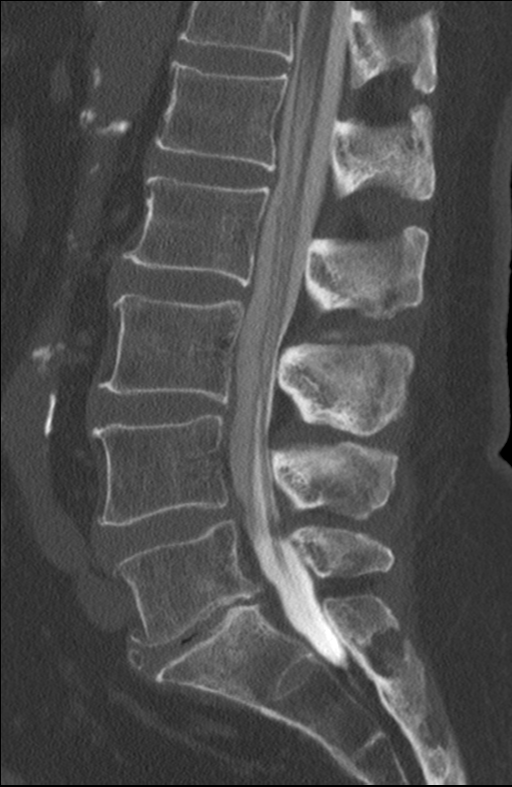
[im 35/52  bone]
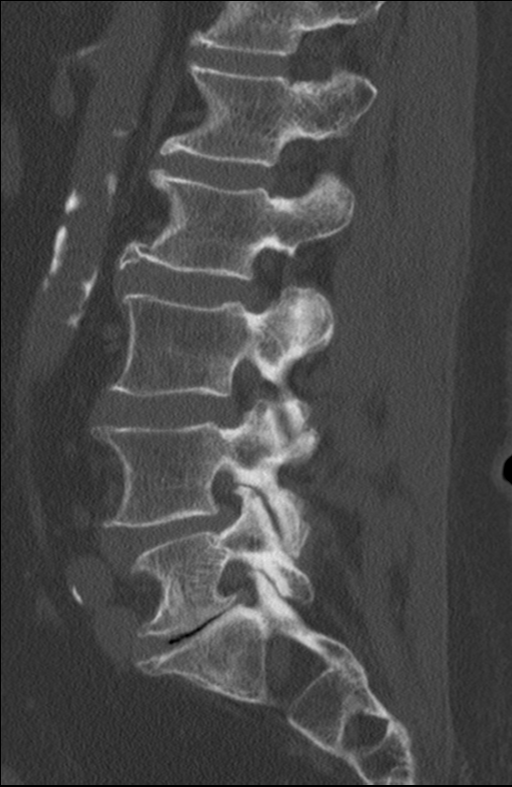

[11 of 33 positions shown; findings below may reference images not displayed]

EXAM:
LUMBAR MYELOGRAM

FLUOROSCOPY TIME:  0 minutes 43 seconds. 468.94 micro gray meter
squared

PROCEDURE:
After thorough discussion of risks and benefits of the procedure
including bleeding, infection, injury to nerves, blood vessels,
adjacent structures as well as headache and CSF leak, written and
oral informed consent was obtained. Consent was obtained by Dr. Mfanyana
Vaticano Viana. Time out form was completed. The patient was pre-medicated
with a 13 hour prep.

Patient was positioned prone on the fluoroscopy table. Local
anesthesia was provided with 1% lidocaine without epinephrine after
prepped and draped in the usual sterile fashion. Puncture was
performed at L2-3 using a 3 1/2 inch 22-gauge spinal needle via left
para median approach. Using a single pass through the dura, the
needle was placed within the thecal sac, with return of clear CSF.
10 cc of Isovue 200 was injected into the thecal sac, with normal
opacification of the nerve roots and cauda equina consistent with
free flow within the subarachnoid space.

I personally performed the lumbar puncture and administered the
intrathecal contrast. I also personally performed acquisition of the
myelogram images.
FINDINGS: LUMBAR MYELOGRAM FINDINGS:

Mild curvature convex to the left. No compressive stenosis evident
at L3-4 or above. Anterior extradural defect at L3-4 but no apparent
stenosis. At L4-5, there is a small anterior extradural defect.
There is lateral recess narrowing on the left with poor filling of
the left L5 root sleeve. Chronic disc degeneration at L5-S1 with
loss of disc height. Slightly diminished filling of the right S1
root sleeve compared to the left. Standing lateral flexion extension
views show 2 mm of anterolisthesis at L4-5 that does not change with
flexion or extension.

CT LUMBAR MYELOGRAM FINDINGS:

T12-L1 and L1-2:  Normal.

L2-3:  Mild bulging of the disc.  No stenosis.

L3-4: Moderate bulging of the disc more prominent towards the right.
No compressive stenosis.

L4-5: Bilateral facet arthropathy. Circumferential bulging of the
disc. Narrowing of both subarticular lateral recesses that could
compress either or both L5 nerve roots.

L5-S1: Chronic disc degeneration with loss of disc height, endplate
osteophytes and mild bulging of the disc. Facet degeneration left
worse than right. No compressive stenosis.

There is bilateral sacroiliac osteoarthritis.
IMPRESSION: L3-4: Disc bulge slightly more prominent towards the right but
without apparent neural compression.

L4-5: Bilateral facet degeneration and hypertrophy. 2 mm of
anterolisthesis occurs at this level with standing and flexion.
There is stenosis of both lateral recesses that could compress
either or both L5 nerve roots. This probably slightly more
pronounced on the left.

L5-S1: Chronic disc degeneration. Facet degeneration left more than
right. No apparent compressive stenosis.

## 2016-06-15 ENCOUNTER — Encounter (HOSPITAL_COMMUNITY): Payer: Self-pay | Admitting: Anesthesiology

## 2016-06-15 MED ORDER — VANCOMYCIN HCL 10 G IV SOLR
1500.0000 mg | INTRAVENOUS | Status: AC
  Administered 2016-06-16: 1500 mg via INTRAVENOUS
  Filled 2016-06-15: qty 1500
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
## 2016-06-15 NOTE — Progress Notes (Signed)
PPM form has not been received as of 06/15/16 at 1215.  Spoke with [place] at [facility] who stated that patient needed a stress test prior to surgery and form being returned.  Per Janace Hoard, stress test was performed morning of 06/15/16.  [facility] informed nurse that she would contact the cardiologist to get results and have all information faxed over as soon as possible.

## 2016-06-16 ENCOUNTER — Ambulatory Visit (HOSPITAL_COMMUNITY)
Admission: RE | Admit: 2016-06-16 | Discharge: 2016-06-16 | Disposition: A | Discharge status: Home / Self Care | Payer: Medicare Other | Source: Ambulatory Visit | Attending: Anesthesiology | Admitting: Anesthesiology

## 2016-06-16 ENCOUNTER — Ambulatory Visit (HOSPITAL_COMMUNITY): Payer: Medicare Other | Admitting: Vascular Surgery

## 2016-06-16 ENCOUNTER — Ambulatory Visit (HOSPITAL_COMMUNITY): Payer: Medicare Other

## 2016-06-16 ENCOUNTER — Ambulatory Visit (HOSPITAL_COMMUNITY): Payer: Medicare Other | Admitting: Anesthesiology

## 2016-06-16 ENCOUNTER — Encounter (HOSPITAL_COMMUNITY)
Admission: RE | Disposition: A | Discharge status: Home / Self Care | Payer: Self-pay | Source: Ambulatory Visit | Attending: Anesthesiology

## 2016-06-16 ENCOUNTER — Encounter (HOSPITAL_COMMUNITY): Payer: Self-pay | Admitting: Urology

## 2016-06-16 DIAGNOSIS — Z419 Encounter for procedure for purposes other than remedying health state, unspecified: Secondary | ICD-10-CM

## 2016-06-16 DIAGNOSIS — I110 Hypertensive heart disease with heart failure: Secondary | ICD-10-CM | POA: Insufficient documentation

## 2016-06-16 DIAGNOSIS — I2510 Atherosclerotic heart disease of native coronary artery without angina pectoris: Secondary | ICD-10-CM | POA: Diagnosis not present

## 2016-06-16 DIAGNOSIS — J449 Chronic obstructive pulmonary disease, unspecified: Secondary | ICD-10-CM | POA: Insufficient documentation

## 2016-06-16 DIAGNOSIS — I4891 Unspecified atrial fibrillation: Secondary | ICD-10-CM | POA: Diagnosis not present

## 2016-06-16 DIAGNOSIS — M545 Low back pain: Secondary | ICD-10-CM | POA: Insufficient documentation

## 2016-06-16 DIAGNOSIS — Z881 Allergy status to other antibiotic agents status: Secondary | ICD-10-CM | POA: Insufficient documentation

## 2016-06-16 DIAGNOSIS — Z86010 Personal history of colonic polyps: Secondary | ICD-10-CM | POA: Diagnosis not present

## 2016-06-16 DIAGNOSIS — Z91041 Radiographic dye allergy status: Secondary | ICD-10-CM | POA: Insufficient documentation

## 2016-06-16 DIAGNOSIS — Z91030 Bee allergy status: Secondary | ICD-10-CM | POA: Insufficient documentation

## 2016-06-16 DIAGNOSIS — K219 Gastro-esophageal reflux disease without esophagitis: Secondary | ICD-10-CM | POA: Diagnosis not present

## 2016-06-16 DIAGNOSIS — I509 Heart failure, unspecified: Secondary | ICD-10-CM | POA: Diagnosis not present

## 2016-06-16 DIAGNOSIS — I252 Old myocardial infarction: Secondary | ICD-10-CM | POA: Insufficient documentation

## 2016-06-16 DIAGNOSIS — Z7984 Long term (current) use of oral hypoglycemic drugs: Secondary | ICD-10-CM | POA: Insufficient documentation

## 2016-06-16 DIAGNOSIS — G8929 Other chronic pain: Secondary | ICD-10-CM | POA: Diagnosis not present

## 2016-06-16 DIAGNOSIS — E1140 Type 2 diabetes mellitus with diabetic neuropathy, unspecified: Secondary | ICD-10-CM | POA: Insufficient documentation

## 2016-06-16 DIAGNOSIS — M4726 Other spondylosis with radiculopathy, lumbar region: Secondary | ICD-10-CM | POA: Insufficient documentation

## 2016-06-16 DIAGNOSIS — Z950 Presence of cardiac pacemaker: Secondary | ICD-10-CM | POA: Diagnosis not present

## 2016-06-16 DIAGNOSIS — I472 Ventricular tachycardia: Secondary | ICD-10-CM | POA: Insufficient documentation

## 2016-06-16 DIAGNOSIS — F329 Major depressive disorder, single episode, unspecified: Secondary | ICD-10-CM | POA: Insufficient documentation

## 2016-06-16 DIAGNOSIS — Z8673 Personal history of transient ischemic attack (TIA), and cerebral infarction without residual deficits: Secondary | ICD-10-CM | POA: Diagnosis not present

## 2016-06-16 DIAGNOSIS — F419 Anxiety disorder, unspecified: Secondary | ICD-10-CM | POA: Insufficient documentation

## 2016-06-16 DIAGNOSIS — E7800 Pure hypercholesterolemia, unspecified: Secondary | ICD-10-CM | POA: Insufficient documentation

## 2016-06-16 DIAGNOSIS — Z885 Allergy status to narcotic agent status: Secondary | ICD-10-CM | POA: Insufficient documentation

## 2016-06-16 DIAGNOSIS — Z7982 Long term (current) use of aspirin: Secondary | ICD-10-CM | POA: Insufficient documentation

## 2016-06-16 DIAGNOSIS — Z880 Allergy status to penicillin: Secondary | ICD-10-CM | POA: Insufficient documentation

## 2016-06-16 DIAGNOSIS — Z87442 Personal history of urinary calculi: Secondary | ICD-10-CM | POA: Insufficient documentation

## 2016-06-16 DIAGNOSIS — Z87891 Personal history of nicotine dependence: Secondary | ICD-10-CM | POA: Insufficient documentation

## 2016-06-16 DIAGNOSIS — Z85820 Personal history of malignant melanoma of skin: Secondary | ICD-10-CM | POA: Insufficient documentation

## 2016-06-16 DIAGNOSIS — Z91013 Allergy to seafood: Secondary | ICD-10-CM | POA: Insufficient documentation

## 2016-06-16 HISTORY — PX: SPINAL CORD STIMULATOR INSERTION: SHX5378

## 2016-06-16 LAB — GLUCOSE, CAPILLARY
Glucose-Capillary: 159 mg/dL — ABNORMAL HIGH (ref 65–99)
Glucose-Capillary: 135 mg/dL — ABNORMAL HIGH (ref 65–99)

## 2016-06-16 SURGERY — INSERTION, SPINAL CORD STIMULATOR, LUMBAR
Anesthesia: Monitor Anesthesia Care

## 2016-06-16 MED ORDER — CHLORHEXIDINE GLUCONATE CLOTH 2 % EX PADS: 6.0000 | MEDICATED_PAD | Freq: Once | CUTANEOUS | 0 refills | Status: AC

## 2016-06-16 MED ORDER — METOCLOPRAMIDE HCL 5 MG/ML IJ SOLN: 10.0000 mg | Freq: Once | INTRAMUSCULAR | Status: DC | PRN

## 2016-06-16 MED ORDER — CLINDAMYCIN HCL 150 MG PO CAPS: 150.0000 mg | ORAL_CAPSULE | Freq: Three times a day (TID) | ORAL | 0 refills | Status: DC

## 2016-06-16 MED ORDER — PROPOFOL 10 MG/ML IV BOLUS
INTRAVENOUS | Status: AC
  Filled 2016-06-16: qty 20

## 2016-06-16 MED ORDER — ROCURONIUM BROMIDE 10 MG/ML (PF) SYRINGE
PREFILLED_SYRINGE | INTRAVENOUS | Status: AC
  Filled 2016-06-16: qty 10

## 2016-06-16 MED ORDER — FENTANYL CITRATE (PF) 100 MCG/2ML IJ SOLN
INTRAMUSCULAR | Status: AC
  Filled 2016-06-16: qty 2

## 2016-06-16 MED ORDER — BACITRACIN-NEOMYCIN-POLYMYXIN 400-5-5000 EX OINT
TOPICAL_OINTMENT | CUTANEOUS | Status: AC
  Filled 2016-06-16: qty 1

## 2016-06-16 MED ORDER — LACTATED RINGERS IV SOLN
INTRAVENOUS | Status: DC | PRN
  Administered 2016-06-16: 07:00:00 via INTRAVENOUS

## 2016-06-16 MED ORDER — SODIUM CHLORIDE 0.9 % IR SOLN
Status: DC | PRN
  Administered 2016-06-16: 08:00:00

## 2016-06-16 MED ORDER — BUPIVACAINE-EPINEPHRINE (PF) 0.5% -1:200000 IJ SOLN
INTRAMUSCULAR | Status: DC | PRN
  Administered 2016-06-16: 30 mL via PERINEURAL

## 2016-06-16 MED ORDER — 0.9 % SODIUM CHLORIDE (POUR BTL) OPTIME
TOPICAL | Status: DC | PRN
  Administered 2016-06-16: 1000 mL

## 2016-06-16 MED ORDER — PROPOFOL 1000 MG/100ML IV EMUL
INTRAVENOUS | Status: AC
  Filled 2016-06-16: qty 100

## 2016-06-16 MED ORDER — MIDAZOLAM HCL 5 MG/5ML IJ SOLN
INTRAMUSCULAR | Status: DC | PRN
  Administered 2016-06-16: 1 mg via INTRAVENOUS

## 2016-06-16 MED ORDER — PROPOFOL 500 MG/50ML IV EMUL
INTRAVENOUS | Status: AC
  Filled 2016-06-16: qty 50

## 2016-06-16 MED ORDER — MEPERIDINE HCL 25 MG/ML IJ SOLN: 6.2500 mg | INTRAMUSCULAR | Status: DC | PRN

## 2016-06-16 MED ORDER — BUPIVACAINE-EPINEPHRINE (PF) 0.5% -1:200000 IJ SOLN
INTRAMUSCULAR | Status: AC
  Filled 2016-06-16: qty 60

## 2016-06-16 MED ORDER — HYDROMORPHONE HCL 4 MG PO TABS: 4.0000 mg | ORAL_TABLET | ORAL | 0 refills | Status: AC | PRN

## 2016-06-16 MED ORDER — PHENYLEPHRINE 40 MCG/ML (10ML) SYRINGE FOR IV PUSH (FOR BLOOD PRESSURE SUPPORT)
PREFILLED_SYRINGE | INTRAVENOUS | Status: AC
  Filled 2016-06-16: qty 10

## 2016-06-16 MED ORDER — PROPOFOL 10 MG/ML IV BOLUS
INTRAVENOUS | Status: DC | PRN
  Administered 2016-06-16: 30 mg via INTRAVENOUS

## 2016-06-16 MED ORDER — ARTIFICIAL TEARS OP OINT
TOPICAL_OINTMENT | OPHTHALMIC | Status: AC
  Filled 2016-06-16: qty 3.5

## 2016-06-16 MED ORDER — CHLORHEXIDINE GLUCONATE CLOTH 2 % EX PADS: 6.0000 | MEDICATED_PAD | Freq: Once | CUTANEOUS | Status: DC

## 2016-06-16 MED ORDER — ONDANSETRON HCL 4 MG/2ML IJ SOLN
INTRAMUSCULAR | Status: AC
  Filled 2016-06-16: qty 2

## 2016-06-16 MED ORDER — FENTANYL CITRATE (PF) 100 MCG/2ML IJ SOLN
25.0000 ug | INTRAMUSCULAR | Status: DC | PRN
Start: 2016-06-16 — End: 2016-06-16

## 2016-06-16 MED ORDER — FENTANYL CITRATE (PF) 100 MCG/2ML IJ SOLN
INTRAMUSCULAR | Status: DC | PRN
  Administered 2016-06-16 (×2): 50 ug via INTRAVENOUS

## 2016-06-16 MED ORDER — ONDANSETRON HCL 4 MG/2ML IJ SOLN
INTRAMUSCULAR | Status: DC | PRN
Start: 2016-06-16 — End: 2016-06-16
  Administered 2016-06-16: 4 mg via INTRAVENOUS

## 2016-06-16 MED ORDER — LIDOCAINE 2% (20 MG/ML) 5 ML SYRINGE
INTRAMUSCULAR | Status: AC
  Filled 2016-06-16: qty 5

## 2016-06-16 MED ORDER — BACITRACIN-NEOMYCIN-POLYMYXIN OINTMENT TUBE
TOPICAL_OINTMENT | CUTANEOUS | Status: DC | PRN
  Administered 2016-06-16: 1 via TOPICAL

## 2016-06-16 MED ORDER — MIDAZOLAM HCL 2 MG/2ML IJ SOLN
INTRAMUSCULAR | Status: AC
  Filled 2016-06-16: qty 2

## 2016-06-16 MED ORDER — PROPOFOL 500 MG/50ML IV EMUL
INTRAVENOUS | Status: DC | PRN
  Administered 2016-06-16: 50 ug/kg/min via INTRAVENOUS

## 2016-06-16 MED ORDER — SUCCINYLCHOLINE CHLORIDE 200 MG/10ML IV SOSY
PREFILLED_SYRINGE | INTRAVENOUS | Status: AC
  Filled 2016-06-16: qty 10

## 2016-06-16 MED ORDER — EPHEDRINE 5 MG/ML INJ
INTRAVENOUS | Status: AC
  Filled 2016-06-16: qty 10

## 2016-06-16 SURGICAL SUPPLY — 64 items
0.5% BUPIVACAINE WITHEPI ×2 IMPLANT
ANCHOR CLIK X NEURO (Stimulator) ×2 IMPLANT
BAG DECANTER FOR FLEXI CONT (MISCELLANEOUS) ×2 IMPLANT
BENZOIN TINCTURE PRP APPL 2/3 (GAUZE/BANDAGES/DRESSINGS) IMPLANT
BINDER ABDOMINAL 12 ML 46-62 (SOFTGOODS) ×2 IMPLANT
BLADE CLIPPER SURG (BLADE) IMPLANT
CABLE OR STIMULATOR 2X8 61 (WIRE) ×4 IMPLANT
CHLORAPREP W/TINT 26ML (MISCELLANEOUS) ×2 IMPLANT
CLIP TI WIDE RED SMALL 6 (CLIP) IMPLANT
DECANTER SPIKE VIAL GLASS SM (MISCELLANEOUS) ×4 IMPLANT
DERMABOND ADVANCED (GAUZE/BANDAGES/DRESSINGS)
DERMABOND ADVANCED .7 DNX12 (GAUZE/BANDAGES/DRESSINGS) IMPLANT
DRAPE C-ARM 42X72 X-RAY (DRAPES) ×2 IMPLANT
DRAPE C-ARMOR (DRAPES) ×2 IMPLANT
DRAPE LAPAROTOMY 100X72X124 (DRAPES) ×2 IMPLANT
DRAPE POUCH INSTRU U-SHP 10X18 (DRAPES) ×2 IMPLANT
DRAPE SURG 17X23 STRL (DRAPES) ×2 IMPLANT
DRSG OPSITE POSTOP 3X4 (GAUZE/BANDAGES/DRESSINGS) ×2 IMPLANT
DRSG OPSITE POSTOP 4X6 (GAUZE/BANDAGES/DRESSINGS) ×2 IMPLANT
ELECT REM PT RETURN 9FT ADLT (ELECTROSURGICAL) ×2
ELECTRODE REM PT RTRN 9FT ADLT (ELECTROSURGICAL) ×1 IMPLANT
GAUZE SPONGE 4X4 16PLY XRAY LF (GAUZE/BANDAGES/DRESSINGS) ×2 IMPLANT
GLOVE BIOGEL PI IND STRL 7.5 (GLOVE) ×1 IMPLANT
GLOVE BIOGEL PI INDICATOR 7.5 (GLOVE) ×1
GLOVE ECLIPSE 7.5 STRL STRAW (GLOVE) ×2 IMPLANT
GLOVE EXAM NITRILE LRG STRL (GLOVE) IMPLANT
GLOVE EXAM NITRILE XL STR (GLOVE) IMPLANT
GLOVE EXAM NITRILE XS STR PU (GLOVE) IMPLANT
GOWN STRL REUS W/ TWL LRG LVL3 (GOWN DISPOSABLE) IMPLANT
GOWN STRL REUS W/ TWL XL LVL3 (GOWN DISPOSABLE) IMPLANT
GOWN STRL REUS W/TWL 2XL LVL3 (GOWN DISPOSABLE) IMPLANT
GOWN STRL REUS W/TWL LRG LVL3 (GOWN DISPOSABLE)
GOWN STRL REUS W/TWL XL LVL3 (GOWN DISPOSABLE)
IPG PRECISION SPECTRA (Stimulator) ×2 IMPLANT
KIT BASIN OR (CUSTOM PROCEDURE TRAY) ×2 IMPLANT
KIT CHARGING (KITS) ×1
KIT CHARGING PRECISION NEURO (KITS) ×1 IMPLANT
KIT PAT PROGRAM FREELINK (KITS) ×1 IMPLANT
KIT ROOM TURNOVER OR (KITS) ×2 IMPLANT
LEAD INFINION CX PERC 70CM (Cable) ×4 IMPLANT
NEEDLE 18GX1X1/2 (RX/OR ONLY) (NEEDLE) IMPLANT
NEEDLE ENTRADA 4.5IN (NEEDLE) ×4 IMPLANT
NEEDLE HYPO 25X1 1.5 SAFETY (NEEDLE) ×2 IMPLANT
NS IRRIG 1000ML POUR BTL (IV SOLUTION) ×2 IMPLANT
PACK LAMINECTOMY NEURO (CUSTOM PROCEDURE TRAY) ×2 IMPLANT
PAD ARMBOARD 7.5X6 YLW CONV (MISCELLANEOUS) ×4 IMPLANT
REMOTE CONTROL KIT (KITS) ×2
SPONGE LAP 4X18 X RAY DECT (DISPOSABLE) ×2 IMPLANT
SPONGE SURGIFOAM ABS GEL SZ50 (HEMOSTASIS) IMPLANT
STAPLER SKIN PROX WIDE 3.9 (STAPLE) ×2 IMPLANT
STRIP CLOSURE SKIN 1/2X4 (GAUZE/BANDAGES/DRESSINGS) IMPLANT
SUT MNCRL AB 4-0 PS2 18 (SUTURE) IMPLANT
SUT SILK 0 (SUTURE) ×1
SUT SILK 0 MO-6 18XCR BRD 8 (SUTURE) ×1 IMPLANT
SUT SILK 0 TIES 10X30 (SUTURE) IMPLANT
SUT SILK 2 0 TIES 10X30 (SUTURE) IMPLANT
SUT VIC AB 2-0 CP2 18 (SUTURE) ×6 IMPLANT
SYR EPIDURAL 5ML GLASS (SYRINGE) ×2 IMPLANT
SYRINGE 10CC LL (SYRINGE) IMPLANT
TOOL LONG TUNNEL (SPINAL CORD STIMULATOR) ×2 IMPLANT
TOWEL OR 17X24 6PK STRL BLUE (TOWEL DISPOSABLE) ×2 IMPLANT
TOWEL OR 17X26 10 PK STRL BLUE (TOWEL DISPOSABLE) ×2 IMPLANT
WATER STERILE IRR 1000ML POUR (IV SOLUTION) ×2 IMPLANT
YANKAUER SUCT BULB TIP NO VENT (SUCTIONS) ×2 IMPLANT

## 2016-06-16 NOTE — Op Note (Signed)
PREOP DX: 1) lumbago  2) lumbar radiculopathy 3) chronic pain  POSTOP DX: 1) lumbago  2) lumbar radiculopathy  3) chronic pain PROCEDURES PERFORMED:1) intraop fluoro 2) placement of 2 16 contact [employer] Infinion leads 3) placement of Spectra SCS generator  SURGEON:Korynn Kenedy  ASSISTANT: NONE  ANESTHESIA: MAC  EBL: <20cc  DESCRIPTION OF PROCEDURE: After a discussion of risks, benefits and alternatives, informed consent was obtained. The patient was taken to the OR, turned prone onto a Jackson table, all pressure points padded, SCD's placed, and an adequate plane of anesthesia induced. A timeout was taken to verify the correct patient, position, personnel, availability of appropriate equipment, and administration of perioperative antibiotics.  The thoracic and lumbar areas were widely prepped with chloraprep and draped into a sterile field. Fluoroscopy was used to plan a left paramedian incision at the L1-L3 levels, and an incision made with a 10 blade and carried down to the dorsolumbar fascia with the bovie and blunt dissection. Retractors were placed and a 14g [employer] tuohy needle placed into the epidural space at the T12-L1interspace using biplanar fluoro and loss-of-resistance technique. The needle was aspirated without any return of fluid. A [employer] INFINION lead was introduced and under live AP fluoro advanced until the distal-most contact overlay the midportion of theT7vertebral body shadow with the rest of the contacts distributed over the T8-9vertebral bodies in a position just right of anatomic midline. A second Infinion lead was placed just left of anatomic midline in the same levels using the same technique. The patient was awakened and the leads tested; impedances were good, and the patient reported good coverage with amplitudes in the 4-7 mA range. 0 silk sutures were  placed in the fascia adjacent to the needles. The needles and stylets were removed under fluoroscopy with no lead migration noted. Leads were then fixed to the fascia  with the sutures and Clik-anchors; repeat images were obtained to verify that there had been no lead migration.   Attention was then turned to creation of a subcutaneous pocket. At the left flank, a 3 cm incision was made with a 10 blade and using the bovie and blunt dissection a pocket of size appropriate to place a SCS generator. The pocket was trialed, and found to be of adequate size. The pocket was inspected for hemostasis, which was found to be excellent. Using reverse seldinger technique, the leads were tunneled to the pocket site, and the leads inserted into the SCS generator. Impedances were checked, and all found to be in the expected range. The leads were then all fixed into position with a self-torquing wrench. The wiring was all carefully coiled, placed behind the generator and placed in the pocket.  Both incisions were copiously irrigated with bacitracin-containing irrigation. The lumbar incision was closed in 2 deep layers of interrupted 2-0 silk (patient reports allergy to vicryl) and the skin closed with staples. The pocket incision was closed with a deeper layer of 2-0 silk interrupted sutures, and the skin closed with staples. Sterile dressings were applied. Needle, sponge, and instrument counts were correct x2 at the end of the case.  The patient was then carefully awakened from anesthesia, turned supine, an abdominal binder placed, and the patient taken to the recovery room where she underwent complex spinal cord stimulator programming under my direction, with the assistance of the company representative.  COMPLICATIONS: NONE  CONDITION: Stable throughout the course of the procedure and immediately afterward  DISPOSITION: discharge to home, with antibiotics and pain  medicine. Discussed care with the patient and  spouse. Followup in clinic will be scheduled in 10-14 days

## 2016-06-16 NOTE — Anesthesia Procedure Notes (Signed)
Procedure Name: MAC Date/Time: 06/16/2016 7:40 AM Performed by: Izora Gala Placement Confirmation: positive ETCO2

## 2016-06-16 NOTE — Addendum Note (Signed)
Addendum  created 06/16/16 1517 by Izora Gala, CRNA   Anesthesia Event edited, Anesthesia Staff edited

## 2016-06-16 NOTE — Anesthesia Postprocedure Evaluation (Signed)
Anesthesia Post Note  Patient: William Small  Procedure(s) Performed: Procedure(s) (LRB): LUMBAR SPINAL CORD STIMULATOR INSERTION (N/A)  Patient location during evaluation: PACU Anesthesia Type: MAC Level of consciousness: awake and alert Pain management: pain level controlled Vital Signs Assessment: post-procedure vital signs reviewed and stable Respiratory status: spontaneous breathing, nonlabored ventilation, respiratory function stable and patient connected to nasal cannula oxygen Cardiovascular status: stable and blood pressure returned to baseline Anesthetic complications: no    Last Vitals:  Vitals:   06/16/16 0939 06/16/16 0940  BP:  136/80  Pulse: 64   Resp:    Temp:      Last Pain:  Vitals:   06/16/16 0930  TempSrc:   PainSc: 4                  Montez Hageman

## 2016-06-16 NOTE — Transfer of Care (Signed)
Immediate Anesthesia Transfer of Care Note  Patient: William Small  Procedure(s) Performed: Procedure(s) with comments: LUMBAR SPINAL CORD STIMULATOR INSERTION (N/A) - Lumbar Spinal cord stimulator insertion  Patient Location: PACU  Anesthesia Type:MAC  Level of Consciousness: awake, alert , oriented and patient cooperative  Airway & Oxygen Therapy: Patient Spontanous Breathing and Patient connected to nasal cannula oxygen  Post-op Assessment: Report given to RN, Post -op Vital signs reviewed and stable and Patient moving all extremities  Post vital signs: Reviewed and stable  Last Vitals:  Vitals:   06/16/16 0618  BP: (!) 148/86  Pulse: 68  Resp: 20  Temp: 36.8 C    Last Pain:  Vitals:   06/16/16 0618  TempSrc: Oral         Complications: No apparent anesthesia complications

## 2016-06-16 NOTE — Anesthesia Preprocedure Evaluation (Signed)
Anesthesia Evaluation  Patient identified by MRN, date of birth, ID band Patient awake    Reviewed: Allergy & Precautions, NPO status , Patient's Chart, lab work & pertinent test results  Airway Mallampati: II  TM Distance: >3 FB Neck ROM: Full    Dental no notable dental hx.    Pulmonary asthma , COPD, former smoker,    Pulmonary exam normal breath sounds clear to auscultation       Cardiovascular hypertension, Pt. on medications + angina + CAD, + Past MI and +CHF  Normal cardiovascular exam+ dysrhythmias Atrial Fibrillation and Ventricular Tachycardia + pacemaker  Rhythm:Regular Rate:Normal  Negative nuclear stress test 06/15/16 Dr Lelon Frohlich   Neuro/Psych CVA, Residual Symptoms negative neurological ROS  negative psych ROS   GI/Hepatic negative GI ROS, Neg liver ROS,   Endo/Other  diabetes, Type 2, Oral Hypoglycemic Agents  Renal/GU negative Renal ROS  negative genitourinary   Musculoskeletal negative musculoskeletal ROS (+)   Abdominal   Peds negative pediatric ROS (+)  Hematology negative hematology ROS (+)   Anesthesia Other Findings   Reproductive/Obstetrics negative OB ROS                            Anesthesia Physical Anesthesia Plan  ASA: III  Anesthesia Plan: MAC   Post-op Pain Management:    Induction:   Airway Management Planned: Simple Face Mask  Additional Equipment:   Intra-op Plan:   Post-operative Plan:   Informed Consent: I have reviewed the patients History and Physical, chart, labs and discussed the procedure including the risks, benefits and alternatives for the proposed anesthesia with the patient or authorized representative who has indicated his/her understanding and acceptance.   Dental advisory given  Plan Discussed with: CRNA  Anesthesia Plan Comments:         Anesthesia Quick Evaluation

## 2016-06-16 NOTE — Discharge Instructions (Signed)
Dr. Maryjean Ka Post-Op Orders   Ice Pack - 20 minutes on (in a pillow case), and 20 minutes off. Wear the ice pack UNDER the binder.  Follow up in office, they will call you for an appointment in 10 days to 2 weeks.  Increase activity gradually.    No lifting anything heavier than a gallon of milk (10 pounds) until seen in the office.  Advance diet slowly as tolerated.  Dressing care:  Keep dressing dry for 3 days, and on Post-op day 4, may shower.  Call for fever, drainage, and redness.  No swimming or bathing in a bathtub (do not get into standing water). RESTART PLAVIX 10/9 (Sunday)

## 2016-06-19 ENCOUNTER — Encounter (HOSPITAL_COMMUNITY): Payer: Self-pay | Admitting: Anesthesiology

## 2016-11-09 DIAGNOSIS — M4800 Spinal stenosis, site unspecified: Secondary | ICD-10-CM

## 2016-11-09 DIAGNOSIS — M5417 Radiculopathy, lumbosacral region: Secondary | ICD-10-CM

## 2016-11-09 HISTORY — DX: Radiculopathy, lumbosacral region: M54.17

## 2016-11-09 HISTORY — DX: Spinal stenosis, site unspecified: M48.00

## 2016-12-02 IMAGING — RF DG C-ARM 61-120 MIN
1 series · 1 of 1 positions shown · non-contrast
Comparison: None

FLUOROSCOPY TIME:  2 minutes 27 seconds

One image

CLINICAL DATA: Spinal stimulator placement.

EXAM:
DG C-ARM 61-120 MIN; THORACOLUMBAR SPINE - 2 VIEW

[Series 1: run · 1 of 1 slices shown]
[im 1/1]
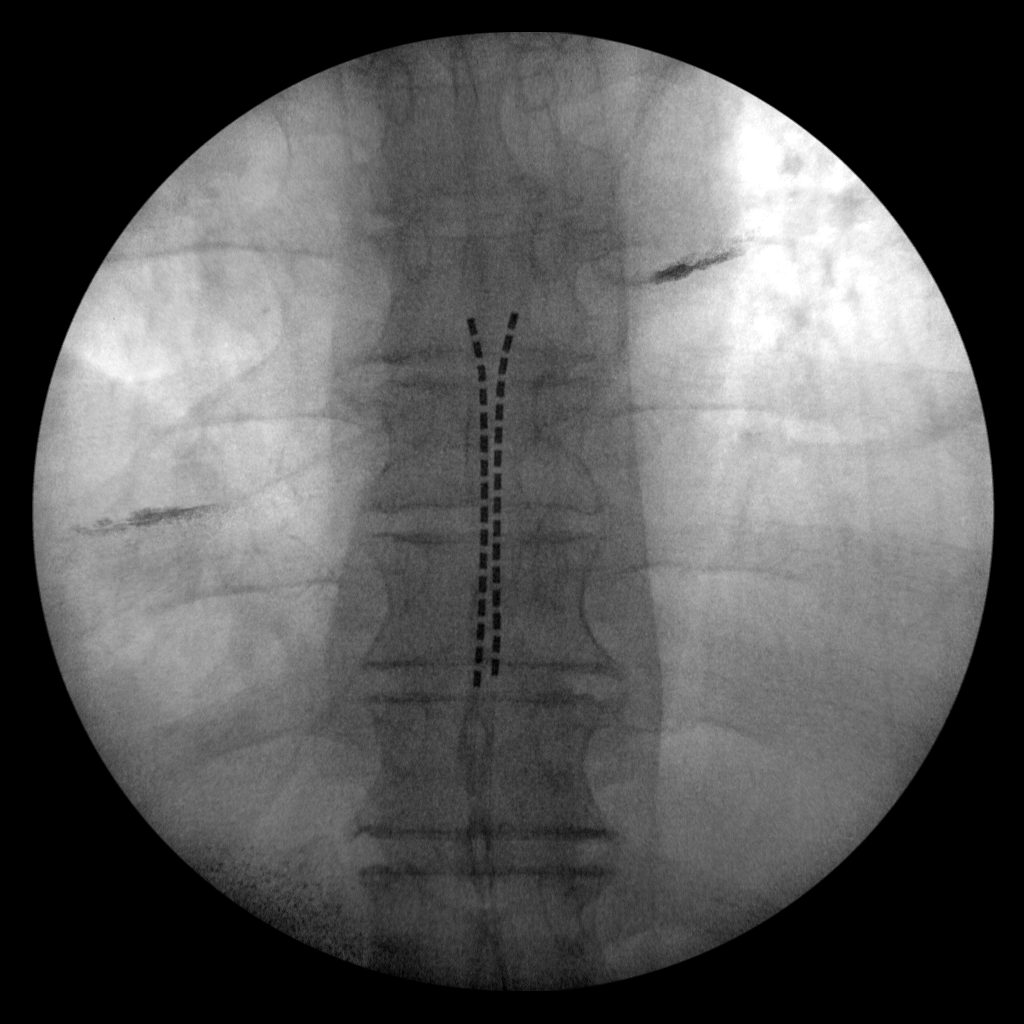

[1 of 1 positions shown; findings below may reference images not displayed]

FINDINGS: Single intraoperative fluoroscopic spot image demonstrates the
distal aspect of the spinal stimulator projecting over the thoracic
spine.
IMPRESSION: Single intraoperative fluoroscopic spot image demonstrates the
distal aspect of the spinal stimulator projecting over the thoracic
spine.

## 2017-01-19 DIAGNOSIS — M23231 Derangement of other medial meniscus due to old tear or injury, right knee: Secondary | ICD-10-CM | POA: Insufficient documentation

## 2017-01-19 HISTORY — DX: Derangement of other medial meniscus due to old tear or injury, right knee: M23.231

## 2017-03-16 DIAGNOSIS — G4733 Obstructive sleep apnea (adult) (pediatric): Secondary | ICD-10-CM | POA: Insufficient documentation

## 2017-03-28 DIAGNOSIS — Z96651 Presence of right artificial knee joint: Secondary | ICD-10-CM

## 2017-03-28 HISTORY — DX: Presence of right artificial knee joint: Z96.651

## 2017-05-25 DIAGNOSIS — R011 Cardiac murmur, unspecified: Secondary | ICD-10-CM | POA: Insufficient documentation

## 2017-05-25 DIAGNOSIS — F419 Anxiety disorder, unspecified: Secondary | ICD-10-CM | POA: Insufficient documentation

## 2017-05-25 DIAGNOSIS — S7000XA Contusion of unspecified hip, initial encounter: Secondary | ICD-10-CM

## 2017-05-25 DIAGNOSIS — G629 Polyneuropathy, unspecified: Secondary | ICD-10-CM | POA: Insufficient documentation

## 2017-05-25 DIAGNOSIS — J449 Chronic obstructive pulmonary disease, unspecified: Secondary | ICD-10-CM | POA: Insufficient documentation

## 2017-05-25 DIAGNOSIS — F32A Depression, unspecified: Secondary | ICD-10-CM | POA: Insufficient documentation

## 2017-05-25 DIAGNOSIS — F329 Major depressive disorder, single episode, unspecified: Secondary | ICD-10-CM | POA: Insufficient documentation

## 2017-05-25 HISTORY — DX: Contusion of unspecified hip, initial encounter: S70.00XA

## 2017-08-09 DIAGNOSIS — I495 Sick sinus syndrome: Secondary | ICD-10-CM | POA: Insufficient documentation

## 2017-08-09 HISTORY — DX: Sick sinus syndrome: I49.5

## 2018-03-07 DIAGNOSIS — M179 Osteoarthritis of knee, unspecified: Secondary | ICD-10-CM | POA: Insufficient documentation

## 2018-03-07 DIAGNOSIS — K5792 Diverticulitis of intestine, part unspecified, without perforation or abscess without bleeding: Secondary | ICD-10-CM | POA: Insufficient documentation

## 2018-03-07 DIAGNOSIS — I509 Heart failure, unspecified: Secondary | ICD-10-CM | POA: Insufficient documentation

## 2018-03-07 DIAGNOSIS — M1710 Unilateral primary osteoarthritis, unspecified knee: Secondary | ICD-10-CM | POA: Insufficient documentation

## 2018-03-07 DIAGNOSIS — M1990 Unspecified osteoarthritis, unspecified site: Secondary | ICD-10-CM | POA: Insufficient documentation
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
## 2018-04-08 ENCOUNTER — Encounter: Payer: Self-pay | Admitting: Cardiology

## 2018-04-08 ENCOUNTER — Ambulatory Visit (INDEPENDENT_AMBULATORY_CARE_PROVIDER_SITE_OTHER): Payer: Medicare Other | Admitting: Cardiology

## 2018-04-08 VITALS — BP 130/68 | HR 64 | Ht 74.5 in | Wt 218.0 lb

## 2018-04-08 DIAGNOSIS — I472 Ventricular tachycardia: Secondary | ICD-10-CM | POA: Diagnosis not present

## 2018-04-08 DIAGNOSIS — Z79899 Other long term (current) drug therapy: Secondary | ICD-10-CM | POA: Diagnosis not present

## 2018-04-08 DIAGNOSIS — I5032 Chronic diastolic (congestive) heart failure: Secondary | ICD-10-CM

## 2018-04-08 DIAGNOSIS — I2510 Atherosclerotic heart disease of native coronary artery without angina pectoris: Secondary | ICD-10-CM

## 2018-04-08 DIAGNOSIS — I4729 Other ventricular tachycardia: Secondary | ICD-10-CM

## 2018-04-08 DIAGNOSIS — Z950 Presence of cardiac pacemaker: Secondary | ICD-10-CM | POA: Diagnosis not present

## 2018-04-08 DIAGNOSIS — I495 Sick sinus syndrome: Secondary | ICD-10-CM | POA: Diagnosis not present

## 2018-04-08 MED ORDER — CLOPIDOGREL BISULFATE 75 MG PO TABS: 75.0000 mg | ORAL_TABLET | Freq: Every evening | ORAL | 3 refills | Status: DC

## 2018-04-08 MED ORDER — ASPIRIN 81 MG PO TABS: 325.0000 mg | ORAL_TABLET | Freq: Every evening | ORAL | 3 refills | Status: DC

## 2018-04-08 MED ORDER — ASPIRIN 81 MG PO TABS: 81.0000 mg | ORAL_TABLET | Freq: Every evening | ORAL | 3 refills | Status: AC

## 2018-04-08 NOTE — Patient Instructions (Addendum)
Medication Instructions:  Your physician has recommended you make the following change in your medication:   START: aspirin 81 mg daily.    Labwork: Your physician recommends that you return for lab work today: CMP, TSH, Lipids, and amiodarone level.   Testing/Procedures: None.  Follow-Up:  Your physician wants you to follow-up in: 6 months. You will receive a reminder letter in the mail two months in advance. If you don't receive a letter, please call our office to schedule the follow-up appointment.    Any Other Special Instructions Will Be Listed Below (If Applicable).  Dr. Bettina Gavia has referred you to consult with Dr. Curt Bears regarding pacemaker management. Their office should call you within one week if not please call our office back.       If you need a refill on your cardiac medications before your next appointment, please call your pharmacy.   Aspirin and Your Heart Aspirin is a medicine that affects the way blood clots. Aspirin can be used to help reduce the risk of blood clots, heart attacks, and other heart-related problems. Should I take aspirin? Your health care provider will help you determine whether it is safe and beneficial for you to take aspirin daily. Taking aspirin daily may be beneficial if you:  Have had a heart attack or chest pain.  Have undergone open heart surgery such as coronary artery bypass surgery (CABG).  Have had coronary angioplasty.  Have experienced a stroke or transient ischemic attack (TIA).  Have peripheral vascular disease (PVD).  Have chronic heart rhythm problems such as atrial fibrillation.  Are there any risks of taking aspirin daily? Daily use of aspirin can increase your risk of side effects. Some of these include:  Bleeding. Bleeding problems can be minor or serious. An example of a minor problem is a cut that does not stop bleeding. An example of a more serious problem is stomach bleeding or bleeding into the brain. Your  risk of bleeding is increased if you are also taking non-steroidal anti-inflammatory medicine (NSAIDs).  Increased bruising.  Upset stomach.  An allergic reaction. People who have nasal polyps have an increased risk of developing an aspirin allergy.  What are some guidelines I should follow when taking aspirin?  Take aspirin only as directed by your health care provider. Make sure you understand how much you should take and what form you should take. The two forms of aspirin are: ? Non-enteric-coated. This type of aspirin does not have a coating and is absorbed quickly. Non-enteric-coated aspirin is usually recommended for people with chest pain. This type of aspirin also comes in a chewable form. ? Enteric-coated. This type of aspirin has a special coating that releases the medicine very slowly. Enteric-coated aspirin causes less stomach upset than non-enteric-coated aspirin. This type of aspirin should not be chewed or crushed.  Drink alcohol in moderation. Drinking alcohol increases your risk of bleeding. When should I seek medical care?  You have unusual bleeding or bruising.  You have stomach pain.  You have an allergic reaction. Symptoms of an allergic reaction include: ? Hives. ? Itchy skin. ? Swelling of the lips, tongue, or face.  You have ringing in your ears. When should I seek immediate medical care?  Your bowel movements are bloody, dark red, or black in color.  You vomit or cough up blood.  You have blood in your urine.  You cough, wheeze, or feel short of breath. If you have any of the following symptoms, this is an  emergency. Do not wait to see if the pain will go away. Get medical help at once. Call your local emergency services (911 in the [place] Do not drive yourself to the hospital.  You have severe chest pain, especially if the pain is crushing or pressure-like and spreads to the arms, back, neck, or jaw.  You have stroke-like symptoms, such as: ? Loss of  vision. ? Difficulty talking. ? Numbness or weakness on one side of your body. ? Numbness or weakness in your arm or leg. ? Not thinking clearly or feeling confused.  This information is not intended to replace advice given to you by your health care provider. Make sure you discuss any questions you have with your health care provider. Document Released: 08/10/2008 Document Revised: 01/05/2016 Document Reviewed: 12/03/2013 Elsevier Interactive Patient Education  2018 [employer].

## 2018-04-10 LAB — COMPREHENSIVE METABOLIC PANEL
A/G RATIO: 2 (ref 1.2–2.2)
ALBUMIN: 4.4 g/dL (ref 3.5–4.8)
ALK PHOS: 63 IU/L (ref 39–117)
ALT: 17 IU/L (ref 0–44)
AST: 20 IU/L (ref 0–40)
BILIRUBIN TOTAL: 0.8 mg/dL (ref 0.0–1.2)
BUN / CREAT RATIO: 19 (ref 10–24)
BUN: 19 mg/dL (ref 8–27)
CHLORIDE: 102 mmol/L (ref 96–106)
CO2: 24 mmol/L (ref 20–29)
Calcium: 10.2 mg/dL (ref 8.6–10.2)
Creatinine, Ser: 1.01 mg/dL (ref 0.76–1.27)
GFR calc Af Amer: 85 mL/min/{1.73_m2} (ref >59)
GFR calc non Af Amer: 73 mL/min/{1.73_m2} (ref >59)
GLOBULIN, TOTAL: 2.2 g/dL (ref 1.5–4.5)
Glucose: 128 mg/dL — ABNORMAL HIGH (ref 65–99)
POTASSIUM: 4.6 mmol/L (ref 3.5–5.2)
SODIUM: 141 mmol/L (ref 134–144)
Total Protein: 6.6 g/dL (ref 6.0–8.5)

## 2018-04-10 LAB — LIPID PANEL W/O CHOL/HDL RATIO
CHOLESTEROL TOTAL: 219 mg/dL — AB (ref 100–199)
HDL: 63 mg/dL (ref >39)
LDL Calculated: 92 mg/dL (ref 0–99)
Triglycerides: 322 mg/dL — ABNORMAL HIGH (ref 0–149)
VLDL Cholesterol Cal: 64 mg/dL — ABNORMAL HIGH (ref 5–40)

## 2018-04-10 LAB — AMIODARONE LEVEL
AMIODARONE LVL: 1.5 ug/mL (ref 1.0–2.5)
Noramiodarone,S: 0.9 ug/mL — ABNORMAL LOW (ref 1.0–2.5)

## 2018-04-10 LAB — TSH: TSH: 1.07 u[IU]/mL (ref 0.450–4.500)

## 2018-07-08 ENCOUNTER — Encounter: Payer: Medicare Other | Admitting: Cardiology

## 2018-08-19 ENCOUNTER — Telehealth: Payer: Self-pay | Admitting: *Deleted

## 2018-08-19 MED ORDER — AMIODARONE HCL 200 MG PO TABS: 200.0000 mg | ORAL_TABLET | Freq: Two times a day (BID) | ORAL | 0 refills | Status: DC

## 2018-08-19 NOTE — Telephone Encounter (Signed)
*  STAT* If patient is at the pharmacy, call can be transferred to refill team.   1. Which medications need to be refilled? (please list name of each medication and dose if known) Pacerone 200 mg, bid 2. Which pharmacy/location (including street and city if local pharmacy) is medication to be sent to?[facility]  3. Do they need a 30 day or 90 day supply? [place]

## 2018-09-16 ENCOUNTER — Ambulatory Visit (INDEPENDENT_AMBULATORY_CARE_PROVIDER_SITE_OTHER): Payer: Medicare Other | Admitting: Cardiology

## 2018-09-16 ENCOUNTER — Encounter: Payer: Self-pay | Admitting: Cardiology

## 2018-09-16 VITALS — BP 126/76 | HR 72 | Ht 74.5 in | Wt 220.0 lb

## 2018-09-16 DIAGNOSIS — I472 Ventricular tachycardia, unspecified: Secondary | ICD-10-CM

## 2018-09-16 DIAGNOSIS — I5032 Chronic diastolic (congestive) heart failure: Secondary | ICD-10-CM | POA: Diagnosis not present

## 2018-09-16 DIAGNOSIS — I495 Sick sinus syndrome: Secondary | ICD-10-CM | POA: Diagnosis not present
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
## 2018-09-16 NOTE — Patient Instructions (Signed)
Medication Instructions:  Your physician recommends that you continue on your current medications as directed. Please refer to the Current Medication list given to you today.  *If you need a refill on your cardiac medications before your next appointment, please call your pharmacy*  Labwork: None ordered  Testing/Procedures: None ordered  Follow-Up: Remote monitoring is used to monitor your Pacemaker or ICD from home. This monitoring reduces the number of office visits required to check your device to one time per year. It allows [place] to keep an eye on the functioning of your device to ensure it is working properly. You are scheduled for a device check from home on 12/16/2018. You may send your transmission at any time that day. If you have a wireless device, the transmission will be sent automatically. After your physician reviews your transmission, you will receive a postcard with your next transmission date.  Your physician wants you to follow-up in: 1 year with Dr. Curt Bears.  You will receive a reminder letter in the mail two months in advance. If you don't receive a letter, please call our office to schedule the follow-up appointment.  Thank you for choosing [facility]!!   Trinidad Curet, RN [phone]  Any Other Special Instructions Will Be Listed Below (If Applicable).

## 2018-09-23 ENCOUNTER — Telehealth: Payer: Self-pay | Admitting: *Deleted

## 2018-09-23 NOTE — Telephone Encounter (Signed)
-----   Message from Will Meredith Leeds, MD sent at 09/19/2018 10:06 AM EST ----- I think that would be reasonable. I would prefer another drug as amio has so many side effects and he's pretty young. ----- Message ----- From: Richardo Priest, MD Sent: 09/16/2018   4:32 PM EST To: Constance Haw, MD  He had seen Elvis Coil EP with syncope and nonsutained VT on a monitor before pacemaker insertion.Could we stop amiodarone and monitor with device telemetry? ----- Message ----- From: Constance Haw, MD Sent: 09/16/2018   3:59 PM EST To: Richardo Priest, MD  Aaron Edelman, I saw Mr. Gnau today.  He seems to be doing well.  Do have any more information on his nonsustained VT?  I am curious if he needs his amiodarone long-term.

## 2018-09-23 NOTE — Telephone Encounter (Signed)
Advised to stop Amiodarone per Dr. Curt Bears. Pt agreeable.

## 2018-10-20 ENCOUNTER — Other Ambulatory Visit: Payer: Self-pay | Admitting: Cardiology
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
## 2018-10-31 ENCOUNTER — Ambulatory Visit (INDEPENDENT_AMBULATORY_CARE_PROVIDER_SITE_OTHER): Payer: Medicare Other | Admitting: Cardiology

## 2018-10-31 VITALS — BP 102/72 | HR 71 | Ht 74.0 in | Wt 224.0 lb

## 2018-10-31 DIAGNOSIS — I472 Ventricular tachycardia: Secondary | ICD-10-CM | POA: Diagnosis not present

## 2018-10-31 DIAGNOSIS — Z950 Presence of cardiac pacemaker: Secondary | ICD-10-CM

## 2018-10-31 DIAGNOSIS — I495 Sick sinus syndrome: Secondary | ICD-10-CM

## 2018-10-31 DIAGNOSIS — I119 Hypertensive heart disease without heart failure: Secondary | ICD-10-CM | POA: Diagnosis not present

## 2018-10-31 DIAGNOSIS — E782 Mixed hyperlipidemia: Secondary | ICD-10-CM

## 2018-10-31 DIAGNOSIS — I2510 Atherosclerotic heart disease of native coronary artery without angina pectoris: Secondary | ICD-10-CM | POA: Diagnosis not present

## 2018-10-31 DIAGNOSIS — I4729 Other ventricular tachycardia: Secondary | ICD-10-CM

## 2018-10-31 DIAGNOSIS — Z79899 Other long term (current) drug therapy: Secondary | ICD-10-CM | POA: Diagnosis not present

## 2018-10-31 NOTE — Patient Instructions (Addendum)
Medication Instructions:  Your physician recommends that you continue on your current medications as directed. Please refer to the Current Medication list given to you today.  If you need a refill on your cardiac medications before your next appointment, please call your pharmacy.   Lab work: NONE  Testing/Procedures: NONE  Follow-Up: At [facility], you and your health needs are our priority.  As part of our continuing mission to provide you with exceptional heart care, we have created designated Provider Care Teams.  These Care Teams include your primary Cardiologist (physician) and Advanced Practice Providers (APPs -  Physician Assistants and Nurse Practitioners) who all work together to provide you with the care you need, when you need it. You will need a follow up appointment in 6 months.  Please call our office 2 months in advance to schedule this appointment.    Any Other Special Instructions Will Be Listed Below    KardiaMobile [email or web address]        FDA-cleared, clinical grade mobile EKG monitor: Jodelle Red is the most clinically-validated mobile EKG used by the world's leading cardiac care medical professionals With Basic service, know instantly if your heart rhythm is normal or if atrial fibrillation is detected, and email the last single EKG recording to yourself or your doctor Premium service, available for purchase through the Kardia app for $9.99 per month or $99 per year, includes unlimited history and storage of your EKG recordings, a monthly EKG summary report to share with your doctor, along with the ability to track your blood pressure, activity and weight Includes one KardiaMobile phone clip FREE SHIPPING: Standard delivery 1-3 business days. Orders placed by 11:00am PST will ship that afternoon. Otherwise, will ship next business day. All orders ship via [employer] from [place], [place], the new  wearable EKG by [employer]. Vladimir Faster replaces your original Apple Watch band. The first of its kind, FDA-cleared KardiaBand provides accurate and instant analysis for detecting atrial fibrillation (AF) and normal sinus rhythm in an EKG. Simply place your thumb on the integrated KardiaBand sensor to take a medical-grade EKG in just 30 seconds. Results appear instantly on your Apple Watch. Vladimir Faster is available today for just $199. KardiaBand features are designed exclusively for use with [employer]  - $99 year. The [employer] for [employer] includes [employer]'s revolutionary SmartRhythm monitoring feature. SmartRhythm monitoring uses an intelligent neural network that runs directly on the [employer], constantly acquiring data from the watch's heart rate sensor and its accelerometer. SmartRhythm compares your heart rate to what it expects from your minute-by-minute level of activity. When the network sees a pattern of heart rate and activity that it does not expect, it notifies you to take an EKG. With Lynd, peace of mind is just an EKG away.  Marland Kitchen

## 2018-11-08 LAB — CUP PACEART INCLINIC DEVICE CHECK
Implantable Lead Implant Date: 20150810
Implantable Lead Location: 753860
Implantable Lead Model: 5076
Implantable Lead Model: 5076
Implantable Pulse Generator Implant Date: 20150810
MDC IDC LEAD IMPLANT DT: 20150810
MDC IDC LEAD LOCATION: 753859
MDC IDC SESS DTM: 20200228092455

## 2018-12-16 ENCOUNTER — Telehealth: Payer: Self-pay

## 2018-12-16 ENCOUNTER — Other Ambulatory Visit: Payer: Self-pay

## 2018-12-16 ENCOUNTER — Ambulatory Visit (INDEPENDENT_AMBULATORY_CARE_PROVIDER_SITE_OTHER): Payer: Medicare Other | Admitting: *Deleted

## 2018-12-16 DIAGNOSIS — I495 Sick sinus syndrome: Secondary | ICD-10-CM

## 2018-12-16 LAB — CUP PACEART REMOTE DEVICE CHECK
Battery Impedance: 586 Ohm
Battery Remaining Longevity: 91 mo
Battery Voltage: 2.79 V
Brady Statistic AP VP Percent: 1 %
Brady Statistic AP VS Percent: 31 %
Brady Statistic AS VP Percent: 1 %
Brady Statistic AS VS Percent: 67 %
Date Time Interrogation Session: 20200406122442
Implantable Lead Implant Date: 20150810
Implantable Lead Implant Date: 20150810
Implantable Lead Location: 753859
Implantable Lead Location: 753860
Implantable Lead Model: 5076
Implantable Lead Model: 5076
Implantable Pulse Generator Implant Date: 20150810
Lead Channel Impedance Value: 494 Ohm
Lead Channel Impedance Value: 525 Ohm
Lead Channel Pacing Threshold Amplitude: 0.5 V
Lead Channel Pacing Threshold Amplitude: 0.75 V
Lead Channel Pacing Threshold Pulse Width: 0.4 ms
Lead Channel Pacing Threshold Pulse Width: 0.4 ms
Lead Channel Setting Pacing Amplitude: 1.5 V
Lead Channel Setting Pacing Amplitude: 2 V
Lead Channel Setting Pacing Pulse Width: 0.4 ms
Lead Channel Setting Sensing Sensitivity: 2 mV

## 2018-12-16 NOTE — Telephone Encounter (Signed)
Spoke with patient to remind of missed remote transmission 

## 2018-12-23 ENCOUNTER — Encounter: Payer: Self-pay | Admitting: Cardiology

## 2018-12-23 NOTE — Progress Notes (Signed)
Remote pacemaker transmission.   

## 2019-03-17 ENCOUNTER — Ambulatory Visit (INDEPENDENT_AMBULATORY_CARE_PROVIDER_SITE_OTHER): Payer: Medicare Other | Admitting: *Deleted

## 2019-03-17 DIAGNOSIS — I495 Sick sinus syndrome: Secondary | ICD-10-CM

## 2019-03-18 ENCOUNTER — Telehealth: Payer: Self-pay

## 2019-03-18 NOTE — Telephone Encounter (Signed)
Spoke with patient to remind of missed remote transmission 

## 2019-03-22 LAB — CUP PACEART REMOTE DEVICE CHECK
Battery Impedance: 664 Ohm
Battery Remaining Longevity: 86 mo
Battery Voltage: 2.78 V
Brady Statistic AP VP Percent: 0 %
Brady Statistic AP VS Percent: 27 %
Brady Statistic AS VP Percent: 1 %
Brady Statistic AS VS Percent: 72 %
Date Time Interrogation Session: 20200710124050
Implantable Lead Implant Date: 20150810
Implantable Lead Implant Date: 20150810
Implantable Lead Location: 753859
Implantable Lead Location: 753860
Implantable Lead Model: 5076
Implantable Lead Model: 5076
Implantable Pulse Generator Implant Date: 20150810
Lead Channel Impedance Value: 502 Ohm
Lead Channel Impedance Value: 526 Ohm
Lead Channel Pacing Threshold Amplitude: 0.5 V
Lead Channel Pacing Threshold Amplitude: 0.5 V
Lead Channel Pacing Threshold Pulse Width: 0.4 ms
Lead Channel Pacing Threshold Pulse Width: 0.4 ms
Lead Channel Setting Pacing Amplitude: 1.5 V
Lead Channel Setting Pacing Amplitude: 2 V
Lead Channel Setting Pacing Pulse Width: 0.4 ms
Lead Channel Setting Sensing Sensitivity: 2.8 mV

## 2019-03-23 ENCOUNTER — Encounter: Payer: Self-pay | Admitting: Cardiology

## 2019-03-23 NOTE — Progress Notes (Signed)
Remote pacemaker transmission.   

## 2019-05-03 ENCOUNTER — Other Ambulatory Visit: Payer: Self-pay | Admitting: Cardiology

## 2019-05-08 ENCOUNTER — Ambulatory Visit: Payer: Medicare Other | Admitting: Cardiology

## 2019-06-16 ENCOUNTER — Encounter: Payer: Medicare Other | Admitting: *Deleted
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
## 2019-07-08 ENCOUNTER — Encounter: Payer: Self-pay | Admitting: Cardiology

## 2019-07-08 ENCOUNTER — Ambulatory Visit (INDEPENDENT_AMBULATORY_CARE_PROVIDER_SITE_OTHER): Payer: Medicare Other | Admitting: Cardiology

## 2019-07-08 ENCOUNTER — Other Ambulatory Visit: Payer: Self-pay

## 2019-07-08 VITALS — BP 126/64 | HR 71 | Ht 74.0 in | Wt 221.0 lb

## 2019-07-08 DIAGNOSIS — I472 Ventricular tachycardia, unspecified: Secondary | ICD-10-CM

## 2019-07-08 DIAGNOSIS — I495 Sick sinus syndrome: Secondary | ICD-10-CM

## 2019-07-08 DIAGNOSIS — I2510 Atherosclerotic heart disease of native coronary artery without angina pectoris: Secondary | ICD-10-CM

## 2019-07-08 DIAGNOSIS — I119 Hypertensive heart disease without heart failure: Secondary | ICD-10-CM

## 2019-07-08 DIAGNOSIS — E782 Mixed hyperlipidemia: Secondary | ICD-10-CM

## 2019-07-08 DIAGNOSIS — Z950 Presence of cardiac pacemaker: Secondary | ICD-10-CM

## 2019-07-08 NOTE — Patient Instructions (Signed)
Medication Instructions:  Your physician has recommended you make the following change in your medication:  STOP: Losartan   *If you need a refill on your cardiac medications before your next appointment, please call your pharmacy*  Lab Work: None.  If you have labs (blood work) drawn today and your tests are completely normal, you will receive your results only by: Marland Kitchen MyChart Message (if you have MyChart) OR . A paper copy in the mail If you have any lab test that is abnormal or we need to change your treatment, we will call you to review the results.  Testing/Procedures: None.   Follow-Up: At [facility], you and your health needs are our priority.  As part of our continuing mission to provide you with exceptional heart care, we have created designated Provider Care Teams.  These Care Teams include your primary Cardiologist (physician) and Advanced Practice Providers (APPs -  Physician Assistants and Nurse Practitioners) who all work together to provide you with the care you need, when you need it.  Your next appointment:   6 months  The format for your next appointment:   In Person  Provider:   Shirlee More, MD  Other Instructions   Keep a log of your blood pressure daily sitting and standing. After 2 weeks please bring by the office.

## 2019-08-28 ENCOUNTER — Other Ambulatory Visit: Payer: Self-pay | Admitting: Cardiology

## 2019-09-15 ENCOUNTER — Ambulatory Visit (INDEPENDENT_AMBULATORY_CARE_PROVIDER_SITE_OTHER): Payer: Medicare Other | Admitting: *Deleted

## 2019-09-15 DIAGNOSIS — I495 Sick sinus syndrome: Secondary | ICD-10-CM | POA: Diagnosis not present

## 2019-09-15 LAB — CUP PACEART REMOTE DEVICE CHECK
Battery Impedance: 848 Ohm
Battery Remaining Longevity: 75 mo
Battery Voltage: 2.78 V
Brady Statistic AP VP Percent: 0 %
Brady Statistic AP VS Percent: 27 %
Brady Statistic AS VP Percent: 0 %
Brady Statistic AS VS Percent: 72 %
Date Time Interrogation Session: 20210104092218
Implantable Lead Implant Date: 20150810
Implantable Lead Implant Date: 20150810
Implantable Lead Location: 753859
Implantable Lead Location: 753860
Implantable Lead Model: 5076
Implantable Lead Model: 5076
Implantable Pulse Generator Implant Date: 20150810
Lead Channel Impedance Value: 446 Ohm
Lead Channel Impedance Value: 543 Ohm
Lead Channel Pacing Threshold Amplitude: 0.5 V
Lead Channel Pacing Threshold Amplitude: 0.5 V
Lead Channel Pacing Threshold Pulse Width: 0.4 ms
Lead Channel Pacing Threshold Pulse Width: 0.4 ms
Lead Channel Setting Pacing Amplitude: 1.5 V
Lead Channel Setting Pacing Amplitude: 2 V
Lead Channel Setting Pacing Pulse Width: 0.4 ms
Lead Channel Setting Sensing Sensitivity: 2.8 mV

## 2019-10-13 ENCOUNTER — Other Ambulatory Visit: Payer: Self-pay

## 2019-10-13 ENCOUNTER — Ambulatory Visit (INDEPENDENT_AMBULATORY_CARE_PROVIDER_SITE_OTHER): Payer: Medicare Other | Admitting: Cardiology

## 2019-10-13 ENCOUNTER — Encounter: Payer: Self-pay | Admitting: Cardiology

## 2019-10-13 VITALS — BP 142/84 | HR 68 | Ht 74.0 in | Wt 222.0 lb

## 2019-10-13 DIAGNOSIS — Z950 Presence of cardiac pacemaker: Secondary | ICD-10-CM | POA: Diagnosis not present

## 2019-10-13 DIAGNOSIS — I495 Sick sinus syndrome: Secondary | ICD-10-CM

## 2019-10-13 LAB — CUP PACEART INCLINIC DEVICE CHECK
Battery Impedance: 848 Ohm
Battery Remaining Longevity: 75 mo
Battery Voltage: 2.78 V
Brady Statistic AP VP Percent: 0 %
Brady Statistic AP VS Percent: 27 %
Brady Statistic AS VP Percent: 0 %
Brady Statistic AS VS Percent: 72 %
Date Time Interrogation Session: 20210201111333
Implantable Lead Implant Date: 20150810
Implantable Lead Implant Date: 20150810
Implantable Lead Location: 753859
Implantable Lead Location: 753860
Implantable Lead Model: 5076
Implantable Lead Model: 5076
Implantable Pulse Generator Implant Date: 20150810
Lead Channel Impedance Value: 442 Ohm
Lead Channel Impedance Value: 559 Ohm
Lead Channel Pacing Threshold Amplitude: 0.5 V
Lead Channel Pacing Threshold Amplitude: 0.5 V
Lead Channel Pacing Threshold Pulse Width: 0.4 ms
Lead Channel Pacing Threshold Pulse Width: 0.4 ms
Lead Channel Sensing Intrinsic Amplitude: 4 mV
Lead Channel Sensing Intrinsic Amplitude: 8 mV
Lead Channel Setting Pacing Amplitude: 1.5 V
Lead Channel Setting Pacing Amplitude: 2 V
Lead Channel Setting Pacing Pulse Width: 0.4 ms
Lead Channel Setting Sensing Sensitivity: 2.8 mV
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
## 2019-10-13 NOTE — Patient Instructions (Addendum)
Medication Instructions:  Your physician recommends that you continue on your current medications as directed. Please refer to the Current Medication list given to you today.  *If you need a refill on your cardiac medications before your next appointment, please call your pharmacy*  Labwork: None ordered  Testing/Procedures: None ordered  Follow-Up: Remote monitoring is used to monitor your Pacemaker or ICD from home. This monitoring reduces the number of office visits required to check your device to one time per year. It allows [place] to keep an eye on the functioning of your device to ensure it is working properly. You are scheduled for a device check from home on 12/15/2019. You may send your transmission at any time that day. If you have a wireless device, the transmission will be sent automatically. After your physician reviews your transmission, you will receive a postcard with your next transmission date.  Your physician wants you to follow-up in: 1 year with Dr. Curt Bears. You will receive a reminder letter in the mail two months in advance. If you don't receive a letter, please call our office to schedule the follow-up appointment.   Thank you for choosing [facility]!!   Trinidad Curet, RN [phone]  Any Other Special Instructions Will Be Listed Below (If Applicable).

## 2019-11-10 ENCOUNTER — Other Ambulatory Visit: Payer: Self-pay | Admitting: Cardiology

## 2019-11-11 DIAGNOSIS — G43909 Migraine, unspecified, not intractable, without status migrainosus: Secondary | ICD-10-CM

## 2019-11-11 DIAGNOSIS — G20A1 Parkinson's disease without dyskinesia, without mention of fluctuations: Secondary | ICD-10-CM

## 2019-11-11 DIAGNOSIS — G459 Transient cerebral ischemic attack, unspecified: Secondary | ICD-10-CM

## 2019-11-11 DIAGNOSIS — N400 Enlarged prostate without lower urinary tract symptoms: Secondary | ICD-10-CM | POA: Insufficient documentation

## 2019-11-11 DIAGNOSIS — E785 Hyperlipidemia, unspecified: Secondary | ICD-10-CM | POA: Insufficient documentation

## 2019-11-11 DIAGNOSIS — G20 Parkinson's disease: Secondary | ICD-10-CM

## 2019-11-11 HISTORY — DX: Parkinson's disease: G20

## 2019-11-11 HISTORY — DX: Transient cerebral ischemic attack, unspecified: G45.9

## 2019-11-11 HISTORY — DX: Migraine, unspecified, not intractable, without status migrainosus: G43.909

## 2019-11-11 HISTORY — DX: Benign prostatic hyperplasia without lower urinary tract symptoms: N40.0

## 2019-11-11 HISTORY — DX: Parkinson's disease without dyskinesia, without mention of fluctuations: G20.A1

## 2019-11-17 DIAGNOSIS — D126 Benign neoplasm of colon, unspecified: Secondary | ICD-10-CM

## 2019-11-17 DIAGNOSIS — Z85820 Personal history of malignant melanoma of skin: Secondary | ICD-10-CM

## 2019-11-17 DIAGNOSIS — N401 Enlarged prostate with lower urinary tract symptoms: Secondary | ICD-10-CM

## 2019-11-17 DIAGNOSIS — F4312 Post-traumatic stress disorder, chronic: Secondary | ICD-10-CM

## 2019-11-17 DIAGNOSIS — J309 Allergic rhinitis, unspecified: Secondary | ICD-10-CM | POA: Insufficient documentation

## 2019-11-17 HISTORY — DX: Post-traumatic stress disorder, chronic: F43.12

## 2019-11-17 HISTORY — DX: Personal history of malignant melanoma of skin: Z85.820

## 2019-11-17 HISTORY — DX: Benign prostatic hyperplasia with lower urinary tract symptoms: N40.1

## 2019-11-17 HISTORY — DX: Benign neoplasm of colon, unspecified: D12.6

## 2019-11-21 ENCOUNTER — Other Ambulatory Visit: Payer: Self-pay | Admitting: *Deleted

## 2019-11-21 DIAGNOSIS — I4729 Other ventricular tachycardia: Secondary | ICD-10-CM

## 2019-11-21 DIAGNOSIS — I472 Ventricular tachycardia: Secondary | ICD-10-CM

## 2019-11-21 DIAGNOSIS — Z79899 Other long term (current) drug therapy: Secondary | ICD-10-CM

## 2019-11-22 LAB — HEPATIC FUNCTION PANEL
ALT: 16 IU/L (ref 0–44)
AST: 19 IU/L (ref 0–40)
Albumin: 4.3 g/dL (ref 3.7–4.7)
Alkaline Phosphatase: 64 IU/L (ref 39–117)
Bilirubin Total: 1.1 mg/dL (ref 0.0–1.2)
Bilirubin, Direct: 0.29 mg/dL (ref 0.00–0.40)
Total Protein: 6.2 g/dL (ref 6.0–8.5)

## 2019-11-22 LAB — TSH: TSH: 1.49 u[IU]/mL (ref 0.450–4.500)

## 2019-11-24 ENCOUNTER — Telehealth: Payer: Self-pay | Admitting: Cardiology

## 2019-11-24 ENCOUNTER — Encounter: Payer: Medicare Other | Admitting: Cardiology

## 2019-11-24 NOTE — Telephone Encounter (Signed)
Pt made aware Dr. Curt Bears did not feel he needed to come into the office.  Advised pt to stop Amiodarone, per [place], and informed that we would monitor going forward. Pt will call if NSVT begins/becomes an issue and/or understands office will call if we see anything on his transmission. Patient verbalized understanding and agreeable to plan.

## 2019-11-24 NOTE — Telephone Encounter (Signed)
Patient returning William Small's call. Patient was to have a virtual appt at 12:00pm today with Dr. Curt Bears.

## 2019-11-27 ENCOUNTER — Telehealth: Payer: Self-pay | Admitting: *Deleted

## 2019-11-27 NOTE — Telephone Encounter (Signed)
   [facility] Pre-operative Risk Assessment    Request for surgical clearance:  1. What type of surgery is being performed?  LEFT TOTAL KNEE ARTHROPLASTY   2. When is this surgery scheduled?  TBD / PENDING CLEARANCE   3. What type of clearance is required (medical clearance vs. Pharmacy clearance to hold med vs. Both)?  BOTH  4. Are there any medications that need to be held prior to surgery and how long? PLAVIX   5. Practice name and name of physician performing surgery?  [facility] / DR. [place]   6. What is your office phone number [phone]    7.   What is your office fax number [phone]  8.   Anesthesia type (None, local, MAC, general) ?  GENERAL   William Small 11/27/2019, 10:40 AM  _________________________________________________________________   (provider comments below)

## 2019-11-27 NOTE — Telephone Encounter (Signed)
I am uncomfortable with this hospitalization earlier this month there are no records and I am concerned about withdrawing aspirin  and clopidogrel now from a cardiac perspective but from the perspective of TIA.  We can sign out from a cardiology perspective that the patient is optimized and cleared with the recommendation that their primary care physician answers question of TIA

## 2019-11-27 NOTE — Telephone Encounter (Signed)
   Primary Cardiologist: Shirlee More, MD  Chart reviewed as part of pre-operative protocol coverage. Patient was contacted 11/27/2019 in reference to pre-operative risk assessment for pending surgery as outlined below.  ROERT CULLIGAN was last seen on 10/13/19 by Dr. Curt Bears.  Since that day, SHAEL SEXSON has done well. Goes to [facility] every day.   Therefore, based on ACC/AHA guidelines, the patient would be at acceptable risk for the planned procedure without further cardiovascular testing.   Patient has hx of mild CAD, VT, HTN and SSS s/p PPM. He has been maintained on DAPT.  Most recently admitted 11/10/19 with TIA.   Dr. Bettina Gavia, can patient hold Plavix for 5 days prior to surgery? Please forward your response to P CV DIV PREOP.   Thank you   Once received recommendations, please send last OV note and other requested documents with this clearance.  [place], [place] 11/27/2019, 12:52 PM

## 2019-11-27 NOTE — Telephone Encounter (Signed)
When surgery clearance is sent, PLEASE FAX OVER LAST OFFICE NOTE AND ANY RECENT TESTING DONE ALONG WITH IT.

## 2019-12-15 ENCOUNTER — Ambulatory Visit (INDEPENDENT_AMBULATORY_CARE_PROVIDER_SITE_OTHER): Payer: Medicare Other | Admitting: *Deleted

## 2019-12-15 DIAGNOSIS — I495 Sick sinus syndrome: Secondary | ICD-10-CM | POA: Diagnosis not present

## 2019-12-17 LAB — CUP PACEART REMOTE DEVICE CHECK
Battery Impedance: 955 Ohm
Battery Remaining Longevity: 69 mo
Battery Voltage: 2.78 V
Brady Statistic AP VP Percent: 0 %
Brady Statistic AP VS Percent: 33 %
Brady Statistic AS VP Percent: 0 %
Brady Statistic AS VS Percent: 67 %
Date Time Interrogation Session: 20210406225305
Implantable Lead Implant Date: 20150810
Implantable Lead Implant Date: 20150810
Implantable Lead Location: 753859
Implantable Lead Location: 753860
Implantable Lead Model: 5076
Implantable Lead Model: 5076
Implantable Pulse Generator Implant Date: 20150810
Lead Channel Impedance Value: 395 Ohm
Lead Channel Impedance Value: 449 Ohm
Lead Channel Pacing Threshold Amplitude: 0.5 V
Lead Channel Pacing Threshold Amplitude: 0.5 V
Lead Channel Pacing Threshold Pulse Width: 0.4 ms
Lead Channel Pacing Threshold Pulse Width: 0.4 ms
Lead Channel Setting Pacing Amplitude: 1.5 V
Lead Channel Setting Pacing Amplitude: 2 V
Lead Channel Setting Pacing Pulse Width: 0.4 ms
Lead Channel Setting Sensing Sensitivity: 2 mV

## 2019-12-24 ENCOUNTER — Telehealth: Payer: Self-pay

## 2019-12-24 MED ORDER — METOPROLOL TARTRATE 50 MG PO TABS: 50.0000 mg | ORAL_TABLET | Freq: Two times a day (BID) | ORAL | 3 refills | Status: DC

## 2019-12-24 NOTE — Telephone Encounter (Signed)
-----   Message from Will Meredith Leeds, MD sent at 12/19/2019  9:48 AM EDT ----- Abnormal device interrogation reviewed.  Lead parameters and battery status stable.  1:1 tachycardia. Stop propranolol and start metoprolol 50 mg BID

## 2019-12-24 NOTE — Telephone Encounter (Signed)
Pt verbalized understanding and agreed to try the Metoprolol.

## 2019-12-26 NOTE — Telephone Encounter (Signed)
Follow up     Pt is calling about having the procedure done and is wanting to know about holding his medication so he can get the surgery    Please advise

## 2019-12-26 NOTE — Telephone Encounter (Signed)
I s/w pt and have assured him from a cardiac standpoint he I ok to proceed with surgery. However, Dr. Bettina Gavia states surgeon will need to reach out to PCP Dr. Teressa Lower in regards to Plavix and ASA. I will fax notes to surgeon Dr. Donivan Scull. I will remove from the pre op call back pool. Pt thanked me for the call.

## 2019-12-26 NOTE — Telephone Encounter (Signed)
Can you please let patient know that Dr. Bettina Gavia felt like he was OK for procedure from a cardiac standpoint. However, he stated that he wanted PCP to address to question of when he can stop his Aspirin and Plavix given recent TIA.   Thank you!

## 2020-01-08 DIAGNOSIS — Z01818 Encounter for other preprocedural examination: Secondary | ICD-10-CM | POA: Diagnosis not present

## 2020-03-16 ENCOUNTER — Ambulatory Visit (INDEPENDENT_AMBULATORY_CARE_PROVIDER_SITE_OTHER): Payer: Medicare Other | Admitting: *Deleted

## 2020-03-16 DIAGNOSIS — I495 Sick sinus syndrome: Secondary | ICD-10-CM | POA: Diagnosis not present

## 2020-03-16 LAB — CUP PACEART REMOTE DEVICE CHECK
Battery Impedance: 1036 Ohm
Battery Remaining Longevity: 66 mo
Battery Voltage: 2.79 V
Brady Statistic AP VP Percent: 0 %
Brady Statistic AP VS Percent: 28 %
Brady Statistic AS VP Percent: 3 %
Brady Statistic AS VS Percent: 68 %
Date Time Interrogation Session: 20210706102733
Implantable Lead Implant Date: 20150810
Implantable Lead Implant Date: 20150810
Implantable Lead Location: 753859
Implantable Lead Location: 753860
Implantable Lead Model: 5076
Implantable Lead Model: 5076
Implantable Pulse Generator Implant Date: 20150810
Lead Channel Impedance Value: 480 Ohm
Lead Channel Impedance Value: 525 Ohm
Lead Channel Pacing Threshold Amplitude: 0.5 V
Lead Channel Pacing Threshold Amplitude: 0.625 V
Lead Channel Pacing Threshold Pulse Width: 0.4 ms
Lead Channel Pacing Threshold Pulse Width: 0.4 ms
Lead Channel Setting Pacing Amplitude: 1.5 V
Lead Channel Setting Pacing Amplitude: 2 V
Lead Channel Setting Pacing Pulse Width: 0.4 ms
Lead Channel Setting Sensing Sensitivity: 2 mV

## 2020-03-17 NOTE — Progress Notes (Signed)
Remote pacemaker transmission.   

## 2020-05-13 ENCOUNTER — Telehealth: Payer: Self-pay

## 2020-05-13 NOTE — Telephone Encounter (Signed)
   Primary Cardiologist:Brian Bettina Gavia, MD  Chart reviewed as part of pre-operative protocol coverage. Because of William Small's past medical history and time since last visit, they will require a follow-up visit in order to better assess preoperative cardiovascular risk.  Pre-op covering staff: - Please schedule appointment and call patient to inform them. If patient already had an upcoming appointment within acceptable timeframe, please add "pre-op clearance" to the appointment notes so provider is aware. - Please contact requesting surgeon's office via preferred method (i.e, phone, fax) to inform them of need for appointment prior to surgery.  If applicable, this message will also be routed to pharmacy pool and/or primary cardiologist for input on holding anticoagulant/antiplatelet agent as requested below so that this information is available to the clearing provider at time of patient's appointment.   [place], [place]  05/13/2020, 5:24 PM

## 2020-05-13 NOTE — Telephone Encounter (Signed)
Please make sure follow up is before 9/16 to allow adequate time to help hold aspirin and plavix

## 2020-05-13 NOTE — Telephone Encounter (Signed)
   [facility] Pre-operative Risk Assessment    HEARTCARE STAFF: - Please ensure there is not already an duplicate clearance open for this procedure. - Under Visit Info/Reason for Call, type in Other and utilize the format Clearance MM/DD/YY or Clearance TBD. Do not use dashes or single digits. - If request is for dental extraction, please clarify the # of teeth to be extracted.  Request for surgical clearance:  1. What type of surgery is being performed? EGD Dilation   2. When is this surgery scheduled? 06/01/20   3. What type of clearance is required (medical clearance vs. Pharmacy clearance to hold med vs. Both)? Pharmacy  4. Are there any medications that need to be held prior to surgery and how long? Stop Plavix and Aspirin on 05-27-20   5. Practice name and name of physician performing surgery? [facility]- Dr. Marcello Moores [place]  6. What is the office phone number? [phone]   7.   What is the office fax number? [phone]  8.   Anesthesia type (None, local, MAC, general) ?    Lowella Grip 05/13/2020, 11:01 AM  _________________________________________________________________   (provider comments below)

## 2020-05-14 ENCOUNTER — Other Ambulatory Visit: Payer: Self-pay

## 2020-05-14 ENCOUNTER — Ambulatory Visit (INDEPENDENT_AMBULATORY_CARE_PROVIDER_SITE_OTHER): Payer: Medicare Other | Admitting: Cardiology

## 2020-05-14 ENCOUNTER — Encounter: Payer: Self-pay | Admitting: Cardiology

## 2020-05-14 ENCOUNTER — Ambulatory Visit: Payer: Medicare Other | Admitting: Cardiology

## 2020-05-14 VITALS — BP 131/70 | Ht 74.0 in | Wt 213.8 lb

## 2020-05-14 DIAGNOSIS — Z950 Presence of cardiac pacemaker: Secondary | ICD-10-CM

## 2020-05-14 DIAGNOSIS — I499 Cardiac arrhythmia, unspecified: Secondary | ICD-10-CM | POA: Insufficient documentation

## 2020-05-14 DIAGNOSIS — Z8719 Personal history of other diseases of the digestive system: Secondary | ICD-10-CM | POA: Insufficient documentation

## 2020-05-14 DIAGNOSIS — I119 Hypertensive heart disease without heart failure: Secondary | ICD-10-CM

## 2020-05-14 DIAGNOSIS — I2510 Atherosclerotic heart disease of native coronary artery without angina pectoris: Secondary | ICD-10-CM

## 2020-05-14 DIAGNOSIS — I472 Ventricular tachycardia: Secondary | ICD-10-CM | POA: Diagnosis not present

## 2020-05-14 DIAGNOSIS — J189 Pneumonia, unspecified organism: Secondary | ICD-10-CM | POA: Insufficient documentation

## 2020-05-14 DIAGNOSIS — R519 Headache, unspecified: Secondary | ICD-10-CM | POA: Insufficient documentation

## 2020-05-14 DIAGNOSIS — I495 Sick sinus syndrome: Secondary | ICD-10-CM | POA: Diagnosis not present

## 2020-05-14 DIAGNOSIS — Z01810 Encounter for preprocedural cardiovascular examination: Secondary | ICD-10-CM | POA: Diagnosis not present

## 2020-05-14 DIAGNOSIS — I219 Acute myocardial infarction, unspecified: Secondary | ICD-10-CM | POA: Insufficient documentation

## 2020-05-14 DIAGNOSIS — T7500XA Unspecified effects of lightning, initial encounter: Secondary | ICD-10-CM | POA: Insufficient documentation

## 2020-05-14 DIAGNOSIS — I10 Essential (primary) hypertension: Secondary | ICD-10-CM | POA: Insufficient documentation

## 2020-05-14 DIAGNOSIS — I4729 Other ventricular tachycardia: Secondary | ICD-10-CM

## 2020-05-14 DIAGNOSIS — R410 Disorientation, unspecified: Secondary | ICD-10-CM | POA: Insufficient documentation

## 2020-05-14 DIAGNOSIS — E119 Type 2 diabetes mellitus without complications: Secondary | ICD-10-CM | POA: Insufficient documentation

## 2020-05-14 DIAGNOSIS — E782 Mixed hyperlipidemia: Secondary | ICD-10-CM

## 2020-05-14 DIAGNOSIS — C801 Malignant (primary) neoplasm, unspecified: Secondary | ICD-10-CM | POA: Insufficient documentation

## 2020-05-14 DIAGNOSIS — R42 Dizziness and giddiness: Secondary | ICD-10-CM | POA: Insufficient documentation

## 2020-05-14 DIAGNOSIS — K635 Polyp of colon: Secondary | ICD-10-CM | POA: Insufficient documentation

## 2020-05-14 DIAGNOSIS — K219 Gastro-esophageal reflux disease without esophagitis: Secondary | ICD-10-CM | POA: Insufficient documentation
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
## 2020-05-14 NOTE — Telephone Encounter (Signed)
Pt has been scheduled to see Dr. Bettina Gavia today, 05/15/19 at 3:20 and pt has been made aware.

## 2020-05-14 NOTE — Telephone Encounter (Signed)
Working with me either this afternoon or when I return from vacation

## 2020-05-14 NOTE — Patient Instructions (Signed)
Medication Instructions:  Your physician recommends that you continue on your current medications as directed. Please refer to the Current Medication list given to you today.  Please stop your Aspirin and Plavix one week prior to your procedure *If you need a refill on your cardiac medications before your next appointment, please call your pharmacy*   Lab Work: None If you have labs (blood work) drawn today and your tests are completely normal, you will receive your results only by: Marland Kitchen MyChart Message (if you have MyChart) OR . A paper copy in the mail If you have any lab test that is abnormal or we need to change your treatment, we will call you to review the results.   Testing/Procedures: None   Follow-Up: At [facility], you and your health needs are our priority.  As part of our continuing mission to provide you with exceptional heart care, we have created designated Provider Care Teams.  These Care Teams include your primary Cardiologist (physician) and Advanced Practice Providers (APPs -  Physician Assistants and Nurse Practitioners) who all work together to provide you with the care you need, when you need it.  We recommend signing up for the patient portal called "MyChart".  Sign up information is provided on this After Visit Summary.  MyChart is used to connect with patients for Virtual Visits (Telemedicine).  Patients are able to view lab/test results, encounter notes, upcoming appointments, etc.  Non-urgent messages can be sent to your provider as well.   To learn more about what you can do with MyChart, go to [email or web address].    Your next appointment:   6 month(s)  The format for your next appointment:   In Person  Provider:   Shirlee More, MD   Other Instructions

## 2020-05-14 NOTE — Telephone Encounter (Signed)
Tried calling patient. No answer and no voicemail set up for me to leave a message. 

## 2020-05-26 NOTE — Telephone Encounter (Signed)
William Small is calling stating she needs the preop clearance received and discussed today or the patient's appointment will have to be canceled. Please advise.

## 2020-05-26 NOTE — Telephone Encounter (Signed)
Claiborne Rigg called back. She wanted to let our office know that the patient's procedure has been cancelled per Ortho. The patient has to wait 6 months post op from another procedure. The office will reach out when the new procedure date has been scheduled.

## 2020-06-04 ENCOUNTER — Other Ambulatory Visit: Payer: Self-pay | Admitting: Cardiology

## 2020-06-15 ENCOUNTER — Ambulatory Visit (INDEPENDENT_AMBULATORY_CARE_PROVIDER_SITE_OTHER): Payer: Medicare Other

## 2020-06-15 DIAGNOSIS — I495 Sick sinus syndrome: Secondary | ICD-10-CM | POA: Diagnosis not present

## 2020-06-17 LAB — CUP PACEART REMOTE DEVICE CHECK
Battery Impedance: 1146 Ohm
Battery Remaining Longevity: 60 mo
Battery Voltage: 2.78 V
Brady Statistic AP VP Percent: 2 %
Brady Statistic AP VS Percent: 20 %
Brady Statistic AS VP Percent: 12 %
Brady Statistic AS VS Percent: 66 %
Date Time Interrogation Session: 20211006234548
Implantable Lead Implant Date: 20150810
Implantable Lead Implant Date: 20150810
Implantable Lead Location: 753859
Implantable Lead Location: 753860
Implantable Lead Model: 5076
Implantable Lead Model: 5076
Implantable Pulse Generator Implant Date: 20150810
Lead Channel Impedance Value: 462 Ohm
Lead Channel Impedance Value: 466 Ohm
Lead Channel Pacing Threshold Amplitude: 0.5 V
Lead Channel Pacing Threshold Amplitude: 0.625 V
Lead Channel Pacing Threshold Pulse Width: 0.4 ms
Lead Channel Pacing Threshold Pulse Width: 0.4 ms
Lead Channel Setting Pacing Amplitude: 1.5 V
Lead Channel Setting Pacing Amplitude: 2 V
Lead Channel Setting Pacing Pulse Width: 0.4 ms
Lead Channel Setting Sensing Sensitivity: 2 mV

## 2020-06-17 NOTE — Progress Notes (Signed)
Remote pacemaker transmission.   

## 2020-09-08 ENCOUNTER — Telehealth: Payer: Self-pay

## 2020-09-08 NOTE — Telephone Encounter (Signed)
   Primary Cardiologist: Norman Herrlich, MD  Chart reviewed as part of pre-operative protocol coverage. Patient was contacted 09/08/2020 in reference to pre-operative risk assessment for pending surgery as outlined below.  William Small was last seen on 05/14/20 by Dr. Dulce Sellar.  Since that day, William Small has done well.  Therefore, based on ACC/AHA guidelines, the patient would be at acceptable risk for the planned procedure without further cardiovascular testing.   Previously seen by Dr. Dulce Sellar 05/14/20 for clearance for EGD dilation. Per office note, he may hold Aspirin and Plavix 1 week prior to dilation. Recommended to resume Aspirin within 24-48 hours of procedure and Plavix at first safe opportunity depending on the results of the procedure.   The patient was advised that if he develops new symptoms prior to surgery to contact our office to arrange for a follow-up visit, and he verbalized understanding.  I will route this recommendation to the requesting party via Epic fax function and remove from pre-op pool. Please call with questions.  Alver Sorrow, NP 09/08/2020, 8:38 AM

## 2020-09-08 NOTE — Telephone Encounter (Signed)
° °  [facility] Pre-operative Risk Assessment    HEARTCARE STAFF: - Please ensure there is not already an duplicate clearance open for this procedure. - Under Visit Info/Reason for Call, type in Other and utilize the format Clearance MM/DD/YY or Clearance TBD. Do not use dashes or single digits. - If request is for dental extraction, please clarify the # of teeth to be extracted.  Request for surgical clearance:  1. What type of surgery is being performed? EGD Dilation   2. When is this surgery scheduled? 09/28/2020   3. What type of clearance is required (medical clearance vs. Pharmacy clearance to hold med vs. Both)? Both  4. Are there any medications that need to be held prior to surgery and how long? Aspirin  And Plavix for 5 days.    5. Practice name and name of physician performing surgery? [facility]. Dr. Lyda Jester.    6. What is the office phone number? [phone]   7.   What is the office fax number? [phone]  8.   Anesthesia type (None, local, MAC, general) ? General   Gita Kudo 09/08/2020, 8:25 AM  _________________________________________________________________   (provider comments below)

## 2020-09-14 ENCOUNTER — Ambulatory Visit (INDEPENDENT_AMBULATORY_CARE_PROVIDER_SITE_OTHER): Payer: Medicare Other

## 2020-09-14 DIAGNOSIS — I495 Sick sinus syndrome: Secondary | ICD-10-CM | POA: Diagnosis not present

## 2020-09-16 LAB — CUP PACEART REMOTE DEVICE CHECK
Battery Impedance: 1284 Ohm
Battery Remaining Longevity: 55 mo
Battery Voltage: 2.78 V
Brady Statistic AP VP Percent: 4 %
Brady Statistic AP VS Percent: 20 %
Brady Statistic AS VP Percent: 14 %
Brady Statistic AS VS Percent: 62 %
Date Time Interrogation Session: 20220105234529
Implantable Lead Implant Date: 20150810
Implantable Lead Implant Date: 20150810
Implantable Lead Location: 753859
Implantable Lead Location: 753860
Implantable Lead Model: 5076
Implantable Lead Model: 5076
Implantable Pulse Generator Implant Date: 20150810
Lead Channel Impedance Value: 447 Ohm
Lead Channel Impedance Value: 482 Ohm
Lead Channel Pacing Threshold Amplitude: 0.5 V
Lead Channel Pacing Threshold Amplitude: 0.625 V
Lead Channel Pacing Threshold Pulse Width: 0.4 ms
Lead Channel Pacing Threshold Pulse Width: 0.4 ms
Lead Channel Setting Pacing Amplitude: 1.5 V
Lead Channel Setting Pacing Amplitude: 2 V
Lead Channel Setting Pacing Pulse Width: 0.4 ms
Lead Channel Setting Sensing Sensitivity: 2 mV

## 2020-09-18 DIAGNOSIS — M545 Low back pain, unspecified: Secondary | ICD-10-CM | POA: Insufficient documentation

## 2020-09-18 DIAGNOSIS — H269 Unspecified cataract: Secondary | ICD-10-CM | POA: Insufficient documentation

## 2020-09-18 DIAGNOSIS — G8929 Other chronic pain: Secondary | ICD-10-CM

## 2020-09-18 DIAGNOSIS — N200 Calculus of kidney: Secondary | ICD-10-CM | POA: Insufficient documentation

## 2020-09-18 DIAGNOSIS — M5417 Radiculopathy, lumbosacral region: Secondary | ICD-10-CM

## 2020-09-18 HISTORY — DX: Other chronic pain: G89.29

## 2020-09-18 HISTORY — DX: Radiculopathy, lumbosacral region: M54.17

## 2020-09-18 HISTORY — DX: Low back pain, unspecified: M54.50

## 2020-09-24 ENCOUNTER — Telehealth: Payer: Self-pay | Admitting: *Deleted

## 2020-09-24 NOTE — Telephone Encounter (Signed)
-----   Message from Will Meredith Leeds, MD sent at 09/16/2020  9:53 AM EST ----- Abnormal device interrogation reviewed.  Lead parameters and battery status stable.  NSVT. Increase metoprolol to 100 mg BID.

## 2020-09-24 NOTE — Telephone Encounter (Signed)
Unable to leave message, voicemail full °

## 2020-09-28 NOTE — Progress Notes (Signed)
Remote pacemaker transmission.   

## 2020-10-01 MED ORDER — METOPROLOL TARTRATE 100 MG PO TABS: 100.0000 mg | ORAL_TABLET | Freq: Two times a day (BID) | ORAL | 3 refills | Status: AC

## 2020-10-01 NOTE — Telephone Encounter (Signed)
Pt advised of recommendation. Pt aware will update medication list but will not send in updated Rx until we see how he responds. Scheduled pt next month for his yearly f/u w/ Dr. Curt Bears in the [facility] office, aware we will address refills at this visit. Patient verbalized understanding and agreeable to plan.

## 2020-10-14 DIAGNOSIS — R600 Localized edema: Secondary | ICD-10-CM | POA: Insufficient documentation

## 2020-11-05 ENCOUNTER — Other Ambulatory Visit: Payer: Self-pay

## 2020-11-08 ENCOUNTER — Ambulatory Visit (INDEPENDENT_AMBULATORY_CARE_PROVIDER_SITE_OTHER): Payer: Medicare Other | Admitting: Cardiology

## 2020-11-08 ENCOUNTER — Encounter: Payer: Self-pay | Admitting: Cardiology

## 2020-11-08 ENCOUNTER — Other Ambulatory Visit: Payer: Self-pay

## 2020-11-08 VITALS — BP 114/80 | HR 78 | Ht 74.0 in | Wt 228.0 lb

## 2020-11-08 DIAGNOSIS — I5031 Acute diastolic (congestive) heart failure: Secondary | ICD-10-CM

## 2020-11-08 DIAGNOSIS — I110 Hypertensive heart disease with heart failure: Secondary | ICD-10-CM | POA: Diagnosis not present

## 2020-11-08 DIAGNOSIS — E782 Mixed hyperlipidemia: Secondary | ICD-10-CM

## 2020-11-08 DIAGNOSIS — E8770 Fluid overload, unspecified: Secondary | ICD-10-CM | POA: Diagnosis not present

## 2020-11-08 DIAGNOSIS — F4320 Adjustment disorder, unspecified: Secondary | ICD-10-CM | POA: Insufficient documentation

## 2020-11-08 DIAGNOSIS — I495 Sick sinus syndrome: Secondary | ICD-10-CM | POA: Diagnosis not present

## 2020-11-08 DIAGNOSIS — L603 Nail dystrophy: Secondary | ICD-10-CM | POA: Insufficient documentation

## 2020-11-08 DIAGNOSIS — E1142 Type 2 diabetes mellitus with diabetic polyneuropathy: Secondary | ICD-10-CM | POA: Insufficient documentation

## 2020-11-08 DIAGNOSIS — H9319 Tinnitus, unspecified ear: Secondary | ICD-10-CM | POA: Insufficient documentation

## 2020-11-08 LAB — CUP PACEART INCLINIC DEVICE CHECK
Battery Impedance: 1338 Ohm
Battery Remaining Longevity: 53 mo
Battery Voltage: 2.78 V
Brady Statistic AP VP Percent: 6 %
Brady Statistic AP VS Percent: 20 %
Brady Statistic AS VP Percent: 14 %
Brady Statistic AS VS Percent: 60 %
Date Time Interrogation Session: 20220228144500
Implantable Lead Implant Date: 20150810
Implantable Lead Implant Date: 20150810
Implantable Lead Location: 753859
Implantable Lead Location: 753860
Implantable Lead Model: 5076
Implantable Lead Model: 5076
Implantable Pulse Generator Implant Date: 20150810
Lead Channel Impedance Value: 495 Ohm
Lead Channel Impedance Value: 516 Ohm
Lead Channel Pacing Threshold Amplitude: 0.5 V
Lead Channel Pacing Threshold Amplitude: 0.5 V
Lead Channel Pacing Threshold Pulse Width: 0.4 ms
Lead Channel Pacing Threshold Pulse Width: 0.4 ms
Lead Channel Sensing Intrinsic Amplitude: 2.8 mV
Lead Channel Sensing Intrinsic Amplitude: 4 mV
Lead Channel Setting Pacing Amplitude: 1.5 V
Lead Channel Setting Pacing Amplitude: 2 V
Lead Channel Setting Pacing Pulse Width: 0.4 ms
Lead Channel Setting Sensing Sensitivity: 2 mV

## 2020-11-08 MED ORDER — FUROSEMIDE 40 MG PO TABS: ORAL_TABLET | ORAL | 3 refills | Status: DC

## 2020-11-08 MED ORDER — POTASSIUM CHLORIDE CRYS ER 20 MEQ PO TBCR: EXTENDED_RELEASE_TABLET | ORAL | 3 refills | Status: DC

## 2020-11-08 NOTE — Patient Instructions (Signed)
Medication Instructions:  Your physician recommends that you continue on your current medications as directed. Please refer to the Current Medication list given to you today.  *If you need a refill on your cardiac medications before your next appointment, please call your pharmacy*   Lab Work: None ordered   Testing/Procedures: None ordered   Follow-Up: At [facility], you and your health needs are our priority.  As part of our continuing mission to provide you with exceptional heart care, we have created designated Provider Care Teams.  These Care Teams include your primary Cardiologist (physician) and Advanced Practice Providers (APPs -  Physician Assistants and Nurse Practitioners) who all work together to provide you with the care you need, when you need it.  Remote monitoring is used to monitor your Pacemaker or ICD from home. This monitoring reduces the number of office visits required to check your device to one time per year. It allows [place] to keep an eye on the functioning of your device to ensure it is working properly. You are scheduled for a device check from home on 12/14/2020. You may send your transmission at any time that day. If you have a wireless device, the transmission will be sent automatically. After your physician reviews your transmission, you will receive a postcard with your next transmission date.  Your next appointment:   1 year(s)  The format for your next appointment:   In Person  Provider:   Allegra Lai, MD   Thank you for choosing [facility]!!   Trinidad Curet, RN [phone]

## 2020-11-08 NOTE — Addendum Note (Signed)
Addended by: Jerold Coombe on: 11/08/2020 03:36 PM   Modules accepted: Level of Service

## 2020-11-08 NOTE — Patient Instructions (Signed)
Medication Instructions:  Your physician has recommended you make the following change in your medication:  START: Lasix 40 mg twice daily for 5 days than 40 mg daily START: Potassium 20 meq twice daily for 5 days then 20 meq daily   *If you need a refill on your cardiac medications before your next appointment, please call your pharmacy*   Lab Work: Your physician recommends that you return for lab work:  TODAY: BMET, Mag, BNP  If you have labs (blood work) drawn today and your tests are completely normal, you will receive your results only by: Marland Kitchen MyChart Message (if you have MyChart) OR . A paper copy in the mail If you have any lab test that is abnormal or we need to change your treatment, we will call you to review the results.   Testing/Procedures: None   Follow-Up: At [facility], you and your health needs are our priority.  As part of our continuing mission to provide you with exceptional heart care, we have created designated Provider Care Teams.  These Care Teams include your primary Cardiologist (physician) and Advanced Practice Providers (APPs -  Physician Assistants and Nurse Practitioners) who all work together to provide you with the care you need, when you need it.  We recommend signing up for the patient portal called "MyChart".  Sign up information is provided on this After Visit Summary.  MyChart is used to connect with patients for Virtual Visits (Telemedicine).  Patients are able to view lab/test results, encounter notes, upcoming appointments, etc.  Non-urgent messages can be sent to your provider as well.   To learn more about what you can do with MyChart, go to [email or web address].    Your next appointment:    11/12/2020   The format for your next appointment:   In Person  Provider:   Berniece Salines, DO   Other Instructions
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
## 2020-11-09 LAB — PRO B NATRIURETIC PEPTIDE: NT-Pro BNP: 79 pg/mL (ref 0–486)

## 2020-11-09 LAB — MAGNESIUM: Magnesium: 2.1 mg/dL (ref 1.6–2.3)

## 2020-11-09 LAB — BASIC METABOLIC PANEL
BUN/Creatinine Ratio: 20 (ref 10–24)
BUN: 16 mg/dL (ref 8–27)
CO2: 21 mmol/L (ref 20–29)
Calcium: 9.9 mg/dL (ref 8.6–10.2)
Chloride: 104 mmol/L (ref 96–106)
Creatinine, Ser: 0.81 mg/dL (ref 0.76–1.27)
Glucose: 133 mg/dL — ABNORMAL HIGH (ref 65–99)
Potassium: 4.8 mmol/L (ref 3.5–5.2)
Sodium: 141 mmol/L (ref 134–144)
eGFR: 92 mL/min/{1.73_m2} (ref >59)

## 2020-11-10 DIAGNOSIS — Z950 Presence of cardiac pacemaker: Secondary | ICD-10-CM | POA: Insufficient documentation

## 2020-11-10 DIAGNOSIS — I639 Cerebral infarction, unspecified: Secondary | ICD-10-CM | POA: Insufficient documentation

## 2020-11-10 DIAGNOSIS — M1990 Unspecified osteoarthritis, unspecified site: Secondary | ICD-10-CM | POA: Insufficient documentation

## 2020-11-10 DIAGNOSIS — E7800 Pure hypercholesterolemia, unspecified: Secondary | ICD-10-CM | POA: Insufficient documentation

## 2020-11-10 DIAGNOSIS — J45909 Unspecified asthma, uncomplicated: Secondary | ICD-10-CM | POA: Insufficient documentation

## 2020-11-10 DIAGNOSIS — I209 Angina pectoris, unspecified: Secondary | ICD-10-CM | POA: Insufficient documentation

## 2020-11-12 ENCOUNTER — Other Ambulatory Visit: Payer: Self-pay

## 2020-11-12 ENCOUNTER — Ambulatory Visit: Payer: Non-veteran care | Admitting: Cardiology

## 2020-11-12 ENCOUNTER — Ambulatory Visit (INDEPENDENT_AMBULATORY_CARE_PROVIDER_SITE_OTHER): Payer: Medicare Other | Admitting: Cardiology

## 2020-11-12 ENCOUNTER — Telehealth: Payer: Self-pay | Admitting: *Deleted

## 2020-11-12 ENCOUNTER — Encounter: Payer: Self-pay | Admitting: Cardiology

## 2020-11-12 VITALS — BP 132/74 | HR 91 | Ht 74.0 in | Wt 227.0 lb

## 2020-11-12 DIAGNOSIS — I110 Hypertensive heart disease with heart failure: Secondary | ICD-10-CM | POA: Diagnosis not present

## 2020-11-12 DIAGNOSIS — E8770 Fluid overload, unspecified: Secondary | ICD-10-CM

## 2020-11-12 DIAGNOSIS — I5032 Chronic diastolic (congestive) heart failure: Secondary | ICD-10-CM | POA: Diagnosis not present
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
## 2020-11-12 NOTE — Patient Instructions (Signed)
Medication Instructions:   Your physician has recommended you make the following change in your medication: START:  Lasix 40 mg twice a day for 2 days then 40 mg once a day *If you need a refill on your cardiac medications before your next appointment, please call your pharmacy*   Lab Work: Your physician recommends that you return for lab work: TODAY: BMET, [place]  If you have labs (blood work) drawn today and your tests are completely normal, you will receive your results only by: Marland Kitchen MyChart Message (if you have MyChart) OR . A paper copy in the mail If you have any lab test that is abnormal or we need to change your treatment, we will call you to review the results.   Testing/Procedures: None   Follow-Up: At [facility], you and your health needs are our priority.  As part of our continuing mission to provide you with exceptional heart care, we have created designated Provider Care Teams.  These Care Teams include your primary Cardiologist (physician) and Advanced Practice Providers (APPs -  Physician Assistants and Nurse Practitioners) who all work together to provide you with the care you need, when you need it.  We recommend signing up for the patient portal called "MyChart".  Sign up information is provided on this After Visit Summary.  MyChart is used to connect with patients for Virtual Visits (Telemedicine).  Patients are able to view lab/test results, encounter notes, upcoming appointments, etc.  Non-urgent messages can be sent to your provider as well.   To learn more about what you can do with MyChart, go to [email or web address].    Your next appointment:   3 month(s)  The format for your next appointment:   In Person  Provider:   Berniece Salines, DO   Other Instructions

## 2020-11-12 NOTE — Telephone Encounter (Signed)
Pt had appointment today at 1:20; called and left message checking to see if he had forgotten, (1:57)

## 2020-11-13 LAB — BASIC METABOLIC PANEL
BUN/Creatinine Ratio: 23 (ref 10–24)
BUN: 23 mg/dL (ref 8–27)
CO2: 24 mmol/L (ref 20–29)
Calcium: 9.7 mg/dL (ref 8.6–10.2)
Chloride: 99 mmol/L (ref 96–106)
Creatinine, Ser: 1 mg/dL (ref 0.76–1.27)
Glucose: 213 mg/dL — ABNORMAL HIGH (ref 65–99)
Potassium: 4.5 mmol/L (ref 3.5–5.2)
Sodium: 139 mmol/L (ref 134–144)
eGFR: 78 mL/min/{1.73_m2} (ref >59)

## 2020-11-13 LAB — MAGNESIUM: Magnesium: 2 mg/dL (ref 1.6–2.3)

## 2020-11-17 ENCOUNTER — Telehealth: Payer: Self-pay

## 2020-11-17 NOTE — Telephone Encounter (Signed)
-----   Message from Berniece Salines, DO sent at 11/14/2020  9:17 PM EST ----- Blood glucose elevated but otherwise normal labs.

## 2020-11-17 NOTE — Telephone Encounter (Signed)
Spoke with patient regarding results and recommendation.  Patient verbalizes understanding and is agreeable to plan of care. Advised patient to call back with any issues or concerns.  

## 2020-12-14 ENCOUNTER — Ambulatory Visit (INDEPENDENT_AMBULATORY_CARE_PROVIDER_SITE_OTHER): Payer: Medicare Other

## 2020-12-14 DIAGNOSIS — I495 Sick sinus syndrome: Secondary | ICD-10-CM

## 2020-12-16 LAB — CUP PACEART REMOTE DEVICE CHECK
Battery Impedance: 1428 Ohm
Battery Remaining Longevity: 48 mo
Battery Voltage: 2.78 V
Brady Statistic AP VP Percent: 20 %
Brady Statistic AP VS Percent: 22 %
Brady Statistic AS VP Percent: 17 %
Brady Statistic AS VS Percent: 41 %
Date Time Interrogation Session: 20220407090653
Implantable Lead Implant Date: 20150810
Implantable Lead Implant Date: 20150810
Implantable Lead Location: 753859
Implantable Lead Location: 753860
Implantable Lead Model: 5076
Implantable Lead Model: 5076
Implantable Pulse Generator Implant Date: 20150810
Lead Channel Impedance Value: 495 Ohm
Lead Channel Impedance Value: 499 Ohm
Lead Channel Pacing Threshold Amplitude: 0.5 V
Lead Channel Pacing Threshold Amplitude: 0.625 V
Lead Channel Pacing Threshold Pulse Width: 0.4 ms
Lead Channel Pacing Threshold Pulse Width: 0.4 ms
Lead Channel Setting Pacing Amplitude: 1.5 V
Lead Channel Setting Pacing Amplitude: 2 V
Lead Channel Setting Pacing Pulse Width: 0.4 ms
Lead Channel Setting Sensing Sensitivity: 2 mV

## 2020-12-24 NOTE — Progress Notes (Signed)
Remote pacemaker transmission.   

## 2021-01-14 ENCOUNTER — Telehealth: Payer: Self-pay | Admitting: Cardiology

## 2021-01-14 NOTE — Telephone Encounter (Signed)
Doctor's office called and wanted to let Dr. Harriet Masson know that the patient is suffering from swelling

## 2021-01-17 NOTE — Telephone Encounter (Signed)
Tried calling patient, no answer at this time. Left message for patient to return our call.

## 2021-03-01 ENCOUNTER — Other Ambulatory Visit: Payer: Self-pay

## 2021-03-01 ENCOUNTER — Other Ambulatory Visit: Payer: Self-pay | Admitting: Cardiology

## 2021-03-01 DIAGNOSIS — H903 Sensorineural hearing loss, bilateral: Secondary | ICD-10-CM | POA: Insufficient documentation

## 2021-03-03 ENCOUNTER — Encounter: Payer: Self-pay | Admitting: Cardiology

## 2021-03-03 ENCOUNTER — Ambulatory Visit: Payer: Medicare Other | Admitting: Cardiology

## 2021-03-03 ENCOUNTER — Other Ambulatory Visit: Payer: Self-pay

## 2021-03-03 VITALS — BP 126/82 | HR 84 | Ht 74.0 in | Wt 227.8 lb

## 2021-03-03 DIAGNOSIS — I495 Sick sinus syndrome: Secondary | ICD-10-CM | POA: Diagnosis not present

## 2021-03-03 DIAGNOSIS — I2510 Atherosclerotic heart disease of native coronary artery without angina pectoris: Secondary | ICD-10-CM

## 2021-03-03 DIAGNOSIS — Z79899 Other long term (current) drug therapy: Secondary | ICD-10-CM

## 2021-03-03 DIAGNOSIS — Z461 Encounter for fitting and adjustment of hearing aid: Secondary | ICD-10-CM | POA: Insufficient documentation

## 2021-03-03 DIAGNOSIS — I5032 Chronic diastolic (congestive) heart failure: Secondary | ICD-10-CM

## 2021-03-03 DIAGNOSIS — I110 Hypertensive heart disease with heart failure: Secondary | ICD-10-CM | POA: Diagnosis not present

## 2021-03-03 MED ORDER — POTASSIUM CHLORIDE CRYS ER 20 MEQ PO TBCR: 20.0000 meq | EXTENDED_RELEASE_TABLET | ORAL | 3 refills | Status: AC

## 2021-03-03 MED ORDER — FUROSEMIDE 40 MG PO TABS: 40.0000 mg | ORAL_TABLET | ORAL | 3 refills | Status: DC
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
## 2021-03-03 NOTE — Patient Instructions (Signed)
Medication Instructions:   Your physician has recommended you make the following change in your medication:  CHANGE: Lasix 40 mg on Tuesday and Friday CHANGE: Potassium 20 meq on Tuesday and Friday  *If you need a refill on your cardiac medications before your next appointment, please call your pharmacy*   Lab Work:  Your physician recommends that you return for lab work in:  TODAY: BMET, [place]  If you have labs (blood work) drawn today and your tests are completely normal, you will receive your results only by: [employer] (if you have MyChart) OR A paper copy in the mail If you have any lab test that is abnormal or we need to change your treatment, we will call you to review the results.   Testing/Procedures:  None   Follow-Up: At [facility], you and your health needs are our priority.  As part of our continuing mission to provide you with exceptional heart care, we have created designated Provider Care Teams.  These Care Teams include your primary Cardiologist (physician) and Advanced Practice Providers (APPs -  Physician Assistants and Nurse Practitioners) who all work together to provide you with the care you need, when you need it.  We recommend signing up for the patient portal called "MyChart".  Sign up information is provided on this After Visit Summary.  MyChart is used to connect with patients for Virtual Visits (Telemedicine).  Patients are able to view lab/test results, encounter notes, upcoming appointments, etc.  Non-urgent messages can be sent to your provider as well.   To learn more about what you can do with MyChart, go to [email or web address].    Your next appointment:   6 month(s)  The format for your next appointment:   In Person    Other Instructions

## 2021-03-04 LAB — BASIC METABOLIC PANEL
BUN/Creatinine Ratio: 14 (ref 10–24)
BUN: 13 mg/dL (ref 8–27)
CO2: 24 mmol/L (ref 20–29)
Calcium: 9.7 mg/dL (ref 8.6–10.2)
Chloride: 104 mmol/L (ref 96–106)
Creatinine, Ser: 0.94 mg/dL (ref 0.76–1.27)
Glucose: 189 mg/dL — ABNORMAL HIGH (ref 65–99)
Potassium: 4.1 mmol/L (ref 3.5–5.2)
Sodium: 142 mmol/L (ref 134–144)
eGFR: 84 mL/min/{1.73_m2} (ref >59)

## 2021-03-04 LAB — MAGNESIUM: Magnesium: 2 mg/dL (ref 1.6–2.3)

## 2021-03-25 ENCOUNTER — Other Ambulatory Visit: Payer: Self-pay

## 2021-03-25 MED ORDER — CLOPIDOGREL BISULFATE 75 MG PO TABS: 75.0000 mg | ORAL_TABLET | Freq: Every day | ORAL | 2 refills | Status: DC

## 2021-03-25 NOTE — Telephone Encounter (Signed)
Refill of Clopidogrel 75 mg sent to [facility].

## 2021-05-17 ENCOUNTER — Telehealth: Payer: Self-pay | Admitting: Cardiology

## 2021-05-17 NOTE — Telephone Encounter (Signed)
Left message on William Small's voicemail to please return our call.

## 2021-05-17 NOTE — Telephone Encounter (Signed)
Pt c/o swelling: STAT is pt has developed SOB within 24 hours  If swelling, where is the swelling located? Lower extremities  How much weight have you gained and in what time span? 8 lbs in 3 days  Have you gained 3 pounds in a day or 5 pounds in a week? 8 lbs in 3 days  Do you have a log of your daily weights (if so, list)? yes  Are you currently taking a fluid pill? Yes- but he did not take it from Friday to Sunday  Are you currently SOB? no  Have you traveled recently? No     Please call Earnest Bailey, the nurse from [employer]

## 2021-05-17 NOTE — Telephone Encounter (Signed)
Spoke to [place] just now and she let me know that the patient did not take his fluid pill Friday-Sunday and ended up gaining 8lbs in those three days. She states she had him start back to taking it twice weekly and he has already lost some of this weight. She was just calling to make our providers aware of this.

## 2021-08-18 ENCOUNTER — Encounter: Payer: Self-pay | Admitting: Cardiology

## 2021-08-28 ENCOUNTER — Other Ambulatory Visit: Payer: Self-pay | Admitting: Cardiology

## 2021-11-21 ENCOUNTER — Other Ambulatory Visit: Payer: Self-pay | Admitting: *Deleted

## 2021-11-21 ENCOUNTER — Ambulatory Visit (INDEPENDENT_AMBULATORY_CARE_PROVIDER_SITE_OTHER): Payer: Medicare Other | Admitting: Cardiology

## 2021-11-21 ENCOUNTER — Other Ambulatory Visit: Payer: Self-pay

## 2021-11-21 ENCOUNTER — Encounter: Payer: Self-pay | Admitting: Cardiology

## 2021-11-21 VITALS — BP 118/80 | HR 75 | Ht 74.0 in | Wt 221.6 lb

## 2021-11-21 DIAGNOSIS — I495 Sick sinus syndrome: Secondary | ICD-10-CM | POA: Diagnosis not present

## 2021-11-21 MED ORDER — DILTIAZEM HCL ER COATED BEADS 180 MG PO CP24: 180.0000 mg | ORAL_CAPSULE | Freq: Every day | ORAL | 3 refills | Status: AC

## 2021-11-21 MED ORDER — DILTIAZEM HCL ER COATED BEADS 180 MG PO CP24: 180.0000 mg | ORAL_CAPSULE | Freq: Every day | ORAL | 3 refills | Status: DC
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
## 2021-11-21 NOTE — Patient Instructions (Signed)
Medication Instructions:  ?START DILTIAZEM 180 MG EVERY DAY ?*If you need a refill on your cardiac medications before your next appointment, please call your pharmacy* ? ? ?Lab Work: ?NONE  ?If you have labs (blood work) drawn today and your tests are completely normal, you will receive your results only by: ?MyChart Message (if you have MyChart) OR ?A paper copy in the mail ?If you have any lab test that is abnormal or we need to change your treatment, we will call you to review the results. ? ? ?Testing/Procedures: ?NONE ? ? ?Follow-Up: ?At [facility], you and your health needs are our priority.  As part of our continuing mission to provide you with exceptional heart care, we have created designated Provider Care Teams.  These Care Teams include your primary Cardiologist (physician) and Advanced Practice Providers (APPs -  Physician Assistants and Nurse Practitioners) who all work together to provide you with the care you need, when you need it. ? ?We recommend signing up for the patient portal called "MyChart".  Sign up information is provided on this After Visit Summary.  MyChart is used to connect with patients for Virtual Visits (Telemedicine).  Patients are able to view lab/test results, encounter notes, upcoming appointments, etc.  Non-urgent messages can be sent to your provider as well.   ?To learn more about what you can do with MyChart, go to [email or web address].   ? ?Your next appointment:   ?1 year(s) ? ?The format for your next appointment:   ?In Person ? ?Provider:   ?DR Curt Bears   ? ? ?Other Instructions ?NONE  ?

## 2022-05-11 ENCOUNTER — Ambulatory Visit: Payer: Medicare Other | Admitting: Student

## 2022-05-23 ENCOUNTER — Telehealth: Payer: Self-pay

## 2022-05-23 NOTE — Telephone Encounter (Signed)
I let the patient know that I needed to replace his monitor. I told him it will be 7-14 business days before he receive the monitor.

## 2022-06-23 ENCOUNTER — Ambulatory Visit (INDEPENDENT_AMBULATORY_CARE_PROVIDER_SITE_OTHER): Payer: Medicare Other

## 2022-06-23 DIAGNOSIS — I495 Sick sinus syndrome: Secondary | ICD-10-CM

## 2022-06-23 LAB — CUP PACEART REMOTE DEVICE CHECK
Battery Impedance: 2088 Ohm
Battery Remaining Longevity: 35 mo
Battery Voltage: 2.77 V
Brady Statistic AP VP Percent: 1 %
Brady Statistic AP VS Percent: 7 %
Brady Statistic AS VP Percent: 5 %
Brady Statistic AS VS Percent: 87 %
Date Time Interrogation Session: 20231012201020
Implantable Lead Implant Date: 20150810
Implantable Lead Implant Date: 20150810
Implantable Lead Location: 753859
Implantable Lead Location: 753860
Implantable Lead Model: 5076
Implantable Lead Model: 5076
Implantable Pulse Generator Implant Date: 20150810
Lead Channel Impedance Value: 506 Ohm
Lead Channel Impedance Value: 516 Ohm
Lead Channel Pacing Threshold Amplitude: 0.75 V
Lead Channel Pacing Threshold Amplitude: 0.75 V
Lead Channel Pacing Threshold Pulse Width: 0.4 ms
Lead Channel Pacing Threshold Pulse Width: 0.4 ms
Lead Channel Setting Pacing Amplitude: 1.625
Lead Channel Setting Pacing Amplitude: 2 V
Lead Channel Setting Pacing Pulse Width: 0.4 ms
Lead Channel Setting Sensing Sensitivity: 5.6 mV

## 2022-06-28 NOTE — Progress Notes (Signed)
Remote pacemaker transmission.   

## 2022-09-22 ENCOUNTER — Ambulatory Visit (INDEPENDENT_AMBULATORY_CARE_PROVIDER_SITE_OTHER): Payer: Medicare Other

## 2022-09-22 DIAGNOSIS — I495 Sick sinus syndrome: Secondary | ICD-10-CM | POA: Diagnosis not present

## 2022-09-25 LAB — CUP PACEART REMOTE DEVICE CHECK
Battery Impedance: 2195 Ohm
Battery Remaining Longevity: 34 mo
Battery Voltage: 2.76 V
Brady Statistic AP VP Percent: 0 %
Brady Statistic AP VS Percent: 8 %
Brady Statistic AS VP Percent: 4 %
Brady Statistic AS VS Percent: 88 %
Date Time Interrogation Session: 20240114154022
Implantable Lead Connection Status: 753985
Implantable Lead Connection Status: 753985
Implantable Lead Implant Date: 20150810
Implantable Lead Implant Date: 20150810
Implantable Lead Location: 753859
Implantable Lead Location: 753860
Implantable Lead Model: 5076
Implantable Lead Model: 5076
Implantable Pulse Generator Implant Date: 20150810
Lead Channel Impedance Value: 537 Ohm
Lead Channel Impedance Value: 543 Ohm
Lead Channel Pacing Threshold Amplitude: 0.75 V
Lead Channel Pacing Threshold Amplitude: 0.75 V
Lead Channel Pacing Threshold Pulse Width: 0.4 ms
Lead Channel Pacing Threshold Pulse Width: 0.4 ms
Lead Channel Setting Pacing Amplitude: 1.5 V
Lead Channel Setting Pacing Amplitude: 2 V
Lead Channel Setting Pacing Pulse Width: 0.4 ms
Lead Channel Setting Sensing Sensitivity: 4 mV
Zone Setting Status: 755011
Zone Setting Status: 755011

## 2022-10-09 NOTE — Progress Notes (Signed)
Remote pacemaker transmission.   

## 2022-12-22 ENCOUNTER — Ambulatory Visit (INDEPENDENT_AMBULATORY_CARE_PROVIDER_SITE_OTHER): Payer: Medicare Other

## 2022-12-22 DIAGNOSIS — I495 Sick sinus syndrome: Secondary | ICD-10-CM

## 2022-12-26 LAB — CUP PACEART REMOTE DEVICE CHECK
Battery Impedance: 2180 Ohm
Battery Remaining Longevity: 34 mo
Battery Voltage: 2.76 V
Brady Statistic AP VP Percent: 0 %
Brady Statistic AP VS Percent: 8 %
Brady Statistic AS VP Percent: 3 %
Brady Statistic AS VS Percent: 89 %
Date Time Interrogation Session: 20240415232548
Implantable Lead Connection Status: 753985
Implantable Lead Connection Status: 753985
Implantable Lead Implant Date: 20150810
Implantable Lead Implant Date: 20150810
Implantable Lead Location: 753859
Implantable Lead Location: 753860
Implantable Lead Model: 5076
Implantable Lead Model: 5076
Implantable Pulse Generator Implant Date: 20150810
Lead Channel Impedance Value: 541 Ohm
Lead Channel Impedance Value: 570 Ohm
Lead Channel Pacing Threshold Amplitude: 0.75 V
Lead Channel Pacing Threshold Amplitude: 0.875 V
Lead Channel Pacing Threshold Pulse Width: 0.4 ms
Lead Channel Pacing Threshold Pulse Width: 0.4 ms
Lead Channel Setting Pacing Amplitude: 1.625
Lead Channel Setting Pacing Amplitude: 2 V
Lead Channel Setting Pacing Pulse Width: 0.4 ms
Lead Channel Setting Sensing Sensitivity: 4 mV
Zone Setting Status: 755011
Zone Setting Status: 755011

## 2023-01-24 NOTE — Progress Notes (Signed)
Remote pacemaker transmission.   

## 2023-03-14 DIAGNOSIS — I6389 Other cerebral infarction: Secondary | ICD-10-CM | POA: Diagnosis not present

## 2023-03-23 ENCOUNTER — Ambulatory Visit (INDEPENDENT_AMBULATORY_CARE_PROVIDER_SITE_OTHER): Payer: Medicare Other

## 2023-03-23 DIAGNOSIS — I495 Sick sinus syndrome: Secondary | ICD-10-CM

## 2023-03-26 ENCOUNTER — Encounter: Payer: Self-pay | Admitting: Internal Medicine

## 2023-03-27 LAB — CUP PACEART REMOTE DEVICE CHECK
Battery Impedance: 2318 Ohm
Battery Remaining Longevity: 33 mo
Battery Voltage: 2.75 V
Brady Statistic AP VP Percent: 0 %
Brady Statistic AP VS Percent: 8 %
Brady Statistic AS VP Percent: 3 %
Brady Statistic AS VS Percent: 89 %
Date Time Interrogation Session: 20240715232753
Implantable Lead Connection Status: 753985
Implantable Lead Connection Status: 753985
Implantable Lead Implant Date: 20150810
Implantable Lead Implant Date: 20150810
Implantable Lead Location: 753859
Implantable Lead Location: 753860
Implantable Lead Model: 5076
Implantable Lead Model: 5076
Implantable Pulse Generator Implant Date: 20150810
Lead Channel Impedance Value: 549 Ohm
Lead Channel Impedance Value: 560 Ohm
Lead Channel Pacing Threshold Amplitude: 0.625 V
Lead Channel Pacing Threshold Amplitude: 0.75 V
Lead Channel Pacing Threshold Pulse Width: 0.4 ms
Lead Channel Pacing Threshold Pulse Width: 0.4 ms
Lead Channel Setting Pacing Amplitude: 1.5 V
Lead Channel Setting Pacing Amplitude: 2 V
Lead Channel Setting Pacing Pulse Width: 0.4 ms
Lead Channel Setting Sensing Sensitivity: 5.6 mV
Zone Setting Status: 755011
Zone Setting Status: 755011

## 2023-04-06 NOTE — Progress Notes (Signed)
Remote pacemaker transmission.   

## 2023-05-07 ENCOUNTER — Encounter: Payer: Self-pay | Admitting: Cardiology

## 2023-05-07 ENCOUNTER — Ambulatory Visit: Payer: Medicare Other | Attending: Cardiology | Admitting: Cardiology

## 2023-05-07 ENCOUNTER — Other Ambulatory Visit: Payer: Self-pay

## 2023-05-07 VITALS — BP 120/72 | HR 69 | Ht 74.0 in | Wt 226.6 lb

## 2023-05-07 DIAGNOSIS — I495 Sick sinus syndrome: Secondary | ICD-10-CM | POA: Diagnosis not present

## 2023-05-07 LAB — CUP PACEART INCLINIC DEVICE CHECK
Battery Impedance: 2421 Ohm
Battery Remaining Longevity: 32 mo
Battery Voltage: 2.75 V
Brady Statistic AP VP Percent: 1 %
Brady Statistic AP VS Percent: 10 %
Brady Statistic AS VP Percent: 3 %
Brady Statistic AS VS Percent: 87 %
Date Time Interrogation Session: 20240826091551
Implantable Lead Connection Status: 753985
Implantable Lead Connection Status: 753985
Implantable Lead Implant Date: 20150810
Implantable Lead Implant Date: 20150810
Implantable Lead Location: 753859
Implantable Lead Location: 753860
Implantable Lead Model: 5076
Implantable Lead Model: 5076
Implantable Pulse Generator Implant Date: 20150810
Lead Channel Impedance Value: 552 Ohm
Lead Channel Impedance Value: 617 Ohm
Lead Channel Pacing Threshold Amplitude: 0.75 V
Lead Channel Pacing Threshold Amplitude: 0.75 V
Lead Channel Pacing Threshold Pulse Width: 0.4 ms
Lead Channel Pacing Threshold Pulse Width: 0.4 ms
Lead Channel Sensing Intrinsic Amplitude: 4 mV
Lead Channel Sensing Intrinsic Amplitude: 8 mV
Lead Channel Setting Pacing Amplitude: 1.625
Lead Channel Setting Pacing Amplitude: 2 V
Lead Channel Setting Pacing Pulse Width: 0.4 ms
Lead Channel Setting Sensing Sensitivity: 2.8 mV
Zone Setting Status: 755011
Zone Setting Status: 755011

## 2023-05-07 NOTE — Patient Instructions (Signed)
Medication Instructions:  Your physician recommends that you continue on your current medications as directed. Please refer to the Current Medication list given to you today.  *If you need a refill on your cardiac medications before your next appointment, please call your pharmacy*   Lab Work: None ordered   Testing/Procedures: None ordered   Follow-Up: At [facility], you and your health needs are our priority.  As part of our continuing mission to provide you with exceptional heart care, we have created designated Provider Care Teams.  These Care Teams include your primary Cardiologist (physician) and Advanced Practice Providers (APPs -  Physician Assistants and Nurse Practitioners) who all work together to provide you with the care you need, when you need it.  Remote monitoring is used to monitor your Pacemaker or ICD from home. This monitoring reduces the number of office visits required to check your device to one time per year. It allows [place] to keep an eye on the functioning of your device to ensure it is working properly. You are scheduled for a device check from home on 06/22/23. You may send your transmission at any time that day. If you have a wireless device, the transmission will be sent automatically. After your physician reviews your transmission, you will receive a postcard with your next transmission date.  Your next appointment:   1 year(s)  The format for your next appointment:   In Person  Provider:   Loman Brooklyn, MD    Thank you for choosing [facility]!!   Dory Horn, RN [phone]

## 2023-05-07 NOTE — Progress Notes (Signed)
  Electrophysiology Office Note:   Date:  05/07/2023  ID:  [name], DOB [birth year], MRN [id number]  Primary Cardiologist: Norman Herrlich, MD Electrophysiologist: Regan Lemming, MD      History of Present Illness:   William Small is a 77 y.o. male with h/o sick sinus syndrome seen today for routine electrophysiology followup.  Since last being seen in our clinic the patient reports a recent stroke in July.  He states that he was having vertigo type symptoms and was diagnosed with a vertebrobasilar stroke.  He was not able to have an MRI due to his pacemaker being noncompatible.  He has improved.  He has seen neurology and is now on aspirin and Plavix.  There is a question about him potentially having atrial fibrillation, though his pacemaker interrogation shows short atrial arrhythmias that are not obviously atrial fibrillation.  he denies chest pain, palpitations, dyspnea, PND, orthopnea, nausea, vomiting, dizziness, syncope, edema, weight gain, or early satiety.      He has a history significant for sick sinus syndrome status post Medtronic dual-chamber pacemaker, nonsustained VT, diastolic heart failure, hypertension. He was previously on amiodarone for nonsustained VT but this has since been stopped.        Review of systems complete and found to be negative unless listed in HPI.      EP Information / Studies Reviewed:    EKG is ordered today. Personal review as below.   Sinus rhythm, rate 69  PPM Interrogation-  reviewed in detail today,  See PACEART report.  Device History: Medtronic Dual Chamber PPM implanted 2015 for Sinus Node Dysfunction  Risk Assessment/Calculations:              Physical Exam:   VS:  BP 120/72 (BP Location: Right Arm, Patient Position: Sitting, Cuff Size: Large)   Pulse 69   Ht 6\' 2"  (1.88 m)   Wt 226 lb 9.6 oz (102.8 kg)   SpO2 98%   BMI 29.09 kg/m    Wt Readings from Last 3 Encounters:  05/07/23 226 lb 9.6 oz (102.8 kg)   11/21/21 221 lb 9.6 oz (100.5 kg)  03/03/21 227 lb 12.8 oz (103.3 kg)     GEN: Well nourished, well developed in no acute distress NECK: No JVD; No carotid bruits CARDIAC: Regular rate and rhythm, no murmurs, rubs, gallops RESPIRATORY:  Clear to auscultation without rales, wheezing or rhonchi  ABDOMEN: Soft, non-tender, non-distended EXTREMITIES:  No edema; No deformity   ASSESSMENT AND PLAN:    SND s/p Medtronic PPM  Normal PPM function See Pace Art report No changes today  2.  Nonsustained VT: Previously on low-dose amiodarone.  This has since been stopped.  Minimal episodes noted on device interrogation.  3.  Chronic diastolic heart failure: No obvious volume overload.  4.  Hypertension: Currently well-controlled  Disposition:   Follow up with Dr. Elberta Fortis in 12 months  Signed, Brad Lieurance Jorja Loa, MD

## 2023-06-22 ENCOUNTER — Ambulatory Visit (INDEPENDENT_AMBULATORY_CARE_PROVIDER_SITE_OTHER): Payer: Medicare Other

## 2023-06-22 DIAGNOSIS — I495 Sick sinus syndrome: Secondary | ICD-10-CM | POA: Diagnosis not present

## 2023-06-27 LAB — CUP PACEART REMOTE DEVICE CHECK
Battery Impedance: 2801 Ohm
Battery Remaining Longevity: 27 mo
Battery Voltage: 2.74 V
Brady Statistic AP VP Percent: 2 %
Brady Statistic AP VS Percent: 34 %
Brady Statistic AS VP Percent: 2 %
Brady Statistic AS VS Percent: 63 %
Date Time Interrogation Session: 20241015220124
Implantable Lead Connection Status: 753985
Implantable Lead Connection Status: 753985
Implantable Lead Implant Date: 20150810
Implantable Lead Implant Date: 20150810
Implantable Lead Location: 753859
Implantable Lead Location: 753860
Implantable Lead Model: 5076
Implantable Lead Model: 5076
Implantable Pulse Generator Implant Date: 20150810
Lead Channel Impedance Value: 488 Ohm
Lead Channel Impedance Value: 547 Ohm
Lead Channel Pacing Threshold Amplitude: 0.75 V
Lead Channel Pacing Threshold Amplitude: 0.75 V
Lead Channel Pacing Threshold Pulse Width: 0.4 ms
Lead Channel Pacing Threshold Pulse Width: 0.4 ms
Lead Channel Setting Pacing Amplitude: 1.5 V
Lead Channel Setting Pacing Amplitude: 2 V
Lead Channel Setting Pacing Pulse Width: 0.4 ms
Lead Channel Setting Sensing Sensitivity: 2 mV
Zone Setting Status: 755011
Zone Setting Status: 755011

## 2023-06-29 NOTE — Progress Notes (Signed)
Remote pacemaker transmission.   

## 2023-09-21 ENCOUNTER — Ambulatory Visit: Payer: Medicare Other

## 2023-09-21 DIAGNOSIS — I495 Sick sinus syndrome: Secondary | ICD-10-CM | POA: Diagnosis not present

## 2023-09-26 LAB — CUP PACEART REMOTE DEVICE CHECK
Battery Impedance: 2876 Ohm
Battery Remaining Longevity: 26 mo
Battery Voltage: 2.75 V
Brady Statistic AP VP Percent: 1 %
Brady Statistic AP VS Percent: 33 %
Brady Statistic AS VP Percent: 2 %
Brady Statistic AS VS Percent: 64 %
Date Time Interrogation Session: 20250115093044
Implantable Lead Connection Status: 753985
Implantable Lead Connection Status: 753985
Implantable Lead Implant Date: 20150810
Implantable Lead Implant Date: 20150810
Implantable Lead Location: 753859
Implantable Lead Location: 753860
Implantable Lead Model: 5076
Implantable Lead Model: 5076
Implantable Pulse Generator Implant Date: 20150810
Lead Channel Impedance Value: 528 Ohm
Lead Channel Impedance Value: 624 Ohm
Lead Channel Pacing Threshold Amplitude: 0.75 V
Lead Channel Pacing Threshold Amplitude: 0.875 V
Lead Channel Pacing Threshold Pulse Width: 0.4 ms
Lead Channel Pacing Threshold Pulse Width: 0.4 ms
Lead Channel Setting Pacing Amplitude: 1.625
Lead Channel Setting Pacing Amplitude: 2 V
Lead Channel Setting Pacing Pulse Width: 0.4 ms
Lead Channel Setting Sensing Sensitivity: 4 mV
Zone Setting Status: 755011
Zone Setting Status: 755011

## 2023-10-26 NOTE — Progress Notes (Signed)
Remote pacemaker transmission.

## 2023-10-26 NOTE — Addendum Note (Signed)
Addended by: Elease Etienne A on: 10/26/2023 08:51 AM   Modules accepted: Orders

## 2023-12-21 ENCOUNTER — Ambulatory Visit (INDEPENDENT_AMBULATORY_CARE_PROVIDER_SITE_OTHER): Payer: Medicare Other

## 2023-12-21 DIAGNOSIS — I495 Sick sinus syndrome: Secondary | ICD-10-CM | POA: Diagnosis not present

## 2023-12-27 LAB — CUP PACEART REMOTE DEVICE CHECK
Battery Impedance: 3029 Ohm
Battery Remaining Longevity: 24 mo
Battery Voltage: 2.74 V
Brady Statistic AP VP Percent: 2 %
Brady Statistic AP VS Percent: 29 %
Brady Statistic AS VP Percent: 2 %
Brady Statistic AS VS Percent: 67 %
Date Time Interrogation Session: 20250416090908
Implantable Lead Connection Status: 753985
Implantable Lead Connection Status: 753985
Implantable Lead Implant Date: 20150810
Implantable Lead Implant Date: 20150810
Implantable Lead Location: 753859
Implantable Lead Location: 753860
Implantable Lead Model: 5076
Implantable Lead Model: 5076
Implantable Pulse Generator Implant Date: 20150810
Lead Channel Impedance Value: 475 Ohm
Lead Channel Impedance Value: 549 Ohm
Lead Channel Pacing Threshold Amplitude: 0.75 V
Lead Channel Pacing Threshold Amplitude: 0.75 V
Lead Channel Pacing Threshold Pulse Width: 0.4 ms
Lead Channel Pacing Threshold Pulse Width: 0.4 ms
Lead Channel Setting Pacing Amplitude: 1.5 V
Lead Channel Setting Pacing Amplitude: 2 V
Lead Channel Setting Pacing Pulse Width: 0.4 ms
Lead Channel Setting Sensing Sensitivity: 4 mV
Zone Setting Status: 755011
Zone Setting Status: 755011

## 2024-01-04 ENCOUNTER — Other Ambulatory Visit: Payer: Self-pay | Admitting: Cardiology

## 2024-01-21 ENCOUNTER — Telehealth: Payer: Self-pay | Admitting: Cardiology

## 2024-01-21 NOTE — Telephone Encounter (Signed)
 Pt calling back to give front desk Fax [phone] or [phone] to [facility] for Medical Records

## 2024-01-22 NOTE — Telephone Encounter (Signed)
 Fax number listed on ROI that patient filled out in office. Scanned to [facility] per pt request to have his medical records sent to the [place]. Scanned into chart.

## 2024-01-24 NOTE — Progress Notes (Signed)
 Remote pacemaker transmission.

## 2024-01-24 NOTE — Addendum Note (Signed)
 Addended by: Lott Rouleau A on: 01/24/2024 10:56 AM   Modules accepted: Orders

## 2024-03-27 ENCOUNTER — Ambulatory Visit (INDEPENDENT_AMBULATORY_CARE_PROVIDER_SITE_OTHER)

## 2024-03-27 ENCOUNTER — Ambulatory Visit: Payer: Self-pay | Admitting: Cardiology

## 2024-03-27 DIAGNOSIS — I495 Sick sinus syndrome: Secondary | ICD-10-CM | POA: Diagnosis not present

## 2024-03-27 LAB — CUP PACEART REMOTE DEVICE CHECK
Battery Impedance: 3134 Ohm
Battery Remaining Longevity: 24 mo
Battery Voltage: 2.75 V
Brady Statistic AP VP Percent: 1 %
Brady Statistic AP VS Percent: 24 %
Brady Statistic AS VP Percent: 2 %
Brady Statistic AS VS Percent: 73 %
Date Time Interrogation Session: 20250717071611
Implantable Lead Connection Status: 753985
Implantable Lead Connection Status: 753985
Implantable Lead Implant Date: 20150810
Implantable Lead Implant Date: 20150810
Implantable Lead Location: 753859
Implantable Lead Location: 753860
Implantable Lead Model: 5076
Implantable Lead Model: 5076
Implantable Pulse Generator Implant Date: 20150810
Lead Channel Impedance Value: 552 Ohm
Lead Channel Impedance Value: 620 Ohm
Lead Channel Pacing Threshold Amplitude: 0.75 V
Lead Channel Pacing Threshold Amplitude: 0.75 V
Lead Channel Pacing Threshold Pulse Width: 0.4 ms
Lead Channel Pacing Threshold Pulse Width: 0.4 ms
Lead Channel Setting Pacing Amplitude: 1.5 V
Lead Channel Setting Pacing Amplitude: 2 V
Lead Channel Setting Pacing Pulse Width: 0.4 ms
Lead Channel Setting Sensing Sensitivity: 2.8 mV
Zone Setting Status: 755011
Zone Setting Status: 755011

## 2024-04-16 ENCOUNTER — Telehealth: Payer: Self-pay

## 2024-04-16 NOTE — Telephone Encounter (Signed)
 Called pt lvm wanting to know if pt wants to be released to the [facility] in [place]

## 2024-04-17 NOTE — Telephone Encounter (Signed)
 [facility] [place] [facility] request

## 2024-06-17 NOTE — Progress Notes (Signed)
 Remote PPM Transmission

## 2024-06-26 ENCOUNTER — Encounter

## 2024-09-25 ENCOUNTER — Encounter

## 2024-12-25 ENCOUNTER — Encounter

## 2025-03-26 ENCOUNTER — Encounter

## 2025-06-25 ENCOUNTER — Encounter
